# Patient Record
Sex: Female | Born: 1945
Health system: Southern US, Community
[De-identification: ages and names within clinical notes are randomized; demographics above are authoritative.]

## PROBLEM LIST (undated history)

## (undated) DIAGNOSIS — R87619 Unspecified abnormal cytological findings in specimens from cervix uteri: Secondary | ICD-10-CM

## (undated) DIAGNOSIS — R7303 Prediabetes: Secondary | ICD-10-CM

## (undated) DIAGNOSIS — E785 Hyperlipidemia, unspecified: Secondary | ICD-10-CM

## (undated) DIAGNOSIS — I10 Essential (primary) hypertension: Secondary | ICD-10-CM

## (undated) DIAGNOSIS — K219 Gastro-esophageal reflux disease without esophagitis: Secondary | ICD-10-CM

## (undated) DIAGNOSIS — M810 Age-related osteoporosis without current pathological fracture: Secondary | ICD-10-CM

## (undated) DIAGNOSIS — M1990 Unspecified osteoarthritis, unspecified site: Secondary | ICD-10-CM

## (undated) DIAGNOSIS — G5600 Carpal tunnel syndrome, unspecified upper limb: Secondary | ICD-10-CM

---

## 1988-07-06 HISTORY — PX: BREAST CYST ASPIRATION: SHX578

## 2004-04-17 ENCOUNTER — Ambulatory Visit: Payer: Self-pay | Admitting: Internal Medicine

## 2005-04-23 ENCOUNTER — Ambulatory Visit: Payer: Self-pay | Admitting: Internal Medicine

## 2006-05-18 ENCOUNTER — Ambulatory Visit: Payer: Self-pay | Admitting: Internal Medicine

## 2007-05-24 ENCOUNTER — Ambulatory Visit: Payer: Self-pay | Admitting: Obstetrics and Gynecology

## 2008-05-24 ENCOUNTER — Ambulatory Visit: Payer: Self-pay | Admitting: Obstetrics and Gynecology

## 2008-06-12 ENCOUNTER — Ambulatory Visit: Payer: Self-pay | Admitting: Unknown Physician Specialty

## 2008-06-13 ENCOUNTER — Ambulatory Visit: Payer: Self-pay | Admitting: Unknown Physician Specialty

## 2008-06-14 ENCOUNTER — Inpatient Hospital Stay: Payer: Self-pay | Admitting: Unknown Physician Specialty

## 2009-05-27 ENCOUNTER — Ambulatory Visit: Payer: Self-pay | Admitting: Internal Medicine

## 2010-06-11 ENCOUNTER — Ambulatory Visit: Payer: Self-pay | Admitting: Internal Medicine

## 2011-07-29 ENCOUNTER — Ambulatory Visit: Payer: Self-pay | Admitting: Internal Medicine

## 2011-12-24 ENCOUNTER — Ambulatory Visit: Payer: Self-pay | Admitting: Unknown Physician Specialty

## 2012-06-18 ENCOUNTER — Ambulatory Visit: Payer: Self-pay | Admitting: Unknown Physician Specialty

## 2012-07-29 ENCOUNTER — Ambulatory Visit: Payer: Self-pay | Admitting: Internal Medicine

## 2013-07-31 ENCOUNTER — Ambulatory Visit: Payer: Self-pay | Admitting: Internal Medicine

## 2014-07-31 DIAGNOSIS — N8110 Cystocele, unspecified: Secondary | ICD-10-CM | POA: Insufficient documentation

## 2014-08-16 ENCOUNTER — Ambulatory Visit: Payer: Self-pay | Admitting: Internal Medicine

## 2014-09-03 ENCOUNTER — Ambulatory Visit: Payer: Self-pay | Admitting: Internal Medicine

## 2015-07-10 ENCOUNTER — Other Ambulatory Visit: Payer: Self-pay | Admitting: Internal Medicine

## 2015-07-10 DIAGNOSIS — Z1231 Encounter for screening mammogram for malignant neoplasm of breast: Secondary | ICD-10-CM

## 2015-08-21 ENCOUNTER — Ambulatory Visit
Admission: RE | Admit: 2015-08-21 | Discharge: 2015-08-21 | Disposition: A | Discharge status: Home / Self Care | Payer: PPO | Source: Ambulatory Visit | Attending: Internal Medicine | Admitting: Internal Medicine

## 2015-08-21 ENCOUNTER — Other Ambulatory Visit: Payer: Self-pay | Admitting: Internal Medicine

## 2015-08-21 DIAGNOSIS — Z1231 Encounter for screening mammogram for malignant neoplasm of breast: Secondary | ICD-10-CM

## 2015-09-18 DIAGNOSIS — G8929 Other chronic pain: Secondary | ICD-10-CM | POA: Diagnosis not present

## 2015-09-18 DIAGNOSIS — M25552 Pain in left hip: Secondary | ICD-10-CM | POA: Diagnosis not present

## 2015-09-18 DIAGNOSIS — M25551 Pain in right hip: Secondary | ICD-10-CM | POA: Diagnosis not present

## 2015-09-18 DIAGNOSIS — M25561 Pain in right knee: Secondary | ICD-10-CM | POA: Diagnosis not present

## 2015-09-23 DIAGNOSIS — Z0101 Encounter for examination of eyes and vision with abnormal findings: Secondary | ICD-10-CM | POA: Diagnosis not present

## 2015-10-22 DIAGNOSIS — G8929 Other chronic pain: Secondary | ICD-10-CM | POA: Diagnosis not present

## 2015-10-22 DIAGNOSIS — M25561 Pain in right knee: Secondary | ICD-10-CM | POA: Diagnosis not present

## 2015-10-24 ENCOUNTER — Other Ambulatory Visit: Payer: Self-pay | Admitting: Unknown Physician Specialty

## 2015-10-24 DIAGNOSIS — G8929 Other chronic pain: Secondary | ICD-10-CM

## 2015-10-24 DIAGNOSIS — M25561 Pain in right knee: Principal | ICD-10-CM

## 2015-10-24 DIAGNOSIS — G8921 Chronic pain due to trauma: Secondary | ICD-10-CM

## 2015-11-12 ENCOUNTER — Ambulatory Visit
Admission: RE | Admit: 2015-11-12 | Discharge: 2015-11-12 | Disposition: A | Discharge status: Home / Self Care | Payer: PPO | Source: Ambulatory Visit | Attending: Unknown Physician Specialty | Admitting: Unknown Physician Specialty

## 2015-11-12 DIAGNOSIS — Z9689 Presence of other specified functional implants: Secondary | ICD-10-CM | POA: Diagnosis not present

## 2015-11-12 DIAGNOSIS — M7989 Other specified soft tissue disorders: Secondary | ICD-10-CM | POA: Diagnosis not present

## 2015-11-12 DIAGNOSIS — G8929 Other chronic pain: Secondary | ICD-10-CM

## 2015-11-12 DIAGNOSIS — M25561 Pain in right knee: Secondary | ICD-10-CM | POA: Diagnosis not present

## 2015-11-12 DIAGNOSIS — G8921 Chronic pain due to trauma: Secondary | ICD-10-CM | POA: Insufficient documentation

## 2015-11-12 DIAGNOSIS — M1711 Unilateral primary osteoarthritis, right knee: Secondary | ICD-10-CM | POA: Insufficient documentation

## 2015-11-12 DIAGNOSIS — M25461 Effusion, right knee: Secondary | ICD-10-CM | POA: Insufficient documentation

## 2015-11-19 DIAGNOSIS — D2272 Melanocytic nevi of left lower limb, including hip: Secondary | ICD-10-CM | POA: Diagnosis not present

## 2015-11-19 DIAGNOSIS — L821 Other seborrheic keratosis: Secondary | ICD-10-CM | POA: Diagnosis not present

## 2015-11-19 DIAGNOSIS — D225 Melanocytic nevi of trunk: Secondary | ICD-10-CM | POA: Diagnosis not present

## 2015-11-19 DIAGNOSIS — M12561 Traumatic arthropathy, right knee: Secondary | ICD-10-CM | POA: Diagnosis not present

## 2015-11-19 DIAGNOSIS — L728 Other follicular cysts of the skin and subcutaneous tissue: Secondary | ICD-10-CM | POA: Diagnosis not present

## 2016-02-23 DIAGNOSIS — M25532 Pain in left wrist: Secondary | ICD-10-CM | POA: Diagnosis not present

## 2016-02-23 DIAGNOSIS — S52592A Other fractures of lower end of left radius, initial encounter for closed fracture: Secondary | ICD-10-CM | POA: Diagnosis not present

## 2016-02-23 DIAGNOSIS — M79642 Pain in left hand: Secondary | ICD-10-CM | POA: Diagnosis not present

## 2016-02-25 DIAGNOSIS — S52502A Unspecified fracture of the lower end of left radius, initial encounter for closed fracture: Secondary | ICD-10-CM | POA: Diagnosis not present

## 2016-03-03 DIAGNOSIS — S52502A Unspecified fracture of the lower end of left radius, initial encounter for closed fracture: Secondary | ICD-10-CM | POA: Diagnosis not present

## 2016-03-03 DIAGNOSIS — M25532 Pain in left wrist: Secondary | ICD-10-CM | POA: Diagnosis not present

## 2016-03-17 DIAGNOSIS — M25532 Pain in left wrist: Secondary | ICD-10-CM | POA: Diagnosis not present

## 2016-03-17 DIAGNOSIS — S52502A Unspecified fracture of the lower end of left radius, initial encounter for closed fracture: Secondary | ICD-10-CM | POA: Diagnosis not present

## 2016-04-07 DIAGNOSIS — M25532 Pain in left wrist: Secondary | ICD-10-CM | POA: Diagnosis not present

## 2016-04-07 DIAGNOSIS — S52502A Unspecified fracture of the lower end of left radius, initial encounter for closed fracture: Secondary | ICD-10-CM | POA: Diagnosis not present

## 2016-05-30 DIAGNOSIS — J208 Acute bronchitis due to other specified organisms: Secondary | ICD-10-CM | POA: Diagnosis not present

## 2016-05-30 DIAGNOSIS — B9689 Other specified bacterial agents as the cause of diseases classified elsewhere: Secondary | ICD-10-CM | POA: Diagnosis not present

## 2016-05-30 DIAGNOSIS — J019 Acute sinusitis, unspecified: Secondary | ICD-10-CM | POA: Diagnosis not present

## 2016-06-10 DIAGNOSIS — L718 Other rosacea: Secondary | ICD-10-CM | POA: Diagnosis not present

## 2016-06-10 DIAGNOSIS — L720 Epidermal cyst: Secondary | ICD-10-CM | POA: Diagnosis not present

## 2016-06-15 DIAGNOSIS — Z0000 Encounter for general adult medical examination without abnormal findings: Secondary | ICD-10-CM | POA: Diagnosis not present

## 2016-06-15 DIAGNOSIS — R7309 Other abnormal glucose: Secondary | ICD-10-CM | POA: Diagnosis not present

## 2016-06-15 DIAGNOSIS — I10 Essential (primary) hypertension: Secondary | ICD-10-CM | POA: Diagnosis not present

## 2016-06-22 DIAGNOSIS — N950 Postmenopausal bleeding: Secondary | ICD-10-CM | POA: Diagnosis not present

## 2016-06-22 DIAGNOSIS — M810 Age-related osteoporosis without current pathological fracture: Secondary | ICD-10-CM | POA: Diagnosis not present

## 2016-06-22 DIAGNOSIS — E782 Mixed hyperlipidemia: Secondary | ICD-10-CM | POA: Insufficient documentation

## 2016-06-22 DIAGNOSIS — Z0000 Encounter for general adult medical examination without abnormal findings: Secondary | ICD-10-CM | POA: Diagnosis not present

## 2016-06-22 DIAGNOSIS — Z23 Encounter for immunization: Secondary | ICD-10-CM | POA: Diagnosis not present

## 2016-07-02 DIAGNOSIS — M8588 Other specified disorders of bone density and structure, other site: Secondary | ICD-10-CM | POA: Diagnosis not present

## 2016-07-02 DIAGNOSIS — M810 Age-related osteoporosis without current pathological fracture: Secondary | ICD-10-CM | POA: Diagnosis not present

## 2016-07-07 DIAGNOSIS — L723 Sebaceous cyst: Secondary | ICD-10-CM | POA: Diagnosis not present

## 2016-07-27 ENCOUNTER — Other Ambulatory Visit: Payer: Self-pay | Admitting: Internal Medicine

## 2016-07-27 DIAGNOSIS — Z1231 Encounter for screening mammogram for malignant neoplasm of breast: Secondary | ICD-10-CM

## 2016-07-30 DIAGNOSIS — L720 Epidermal cyst: Secondary | ICD-10-CM | POA: Diagnosis not present

## 2016-08-18 DIAGNOSIS — H353131 Nonexudative age-related macular degeneration, bilateral, early dry stage: Secondary | ICD-10-CM | POA: Diagnosis not present

## 2016-08-24 ENCOUNTER — Ambulatory Visit: Payer: PPO

## 2016-08-26 ENCOUNTER — Ambulatory Visit
Admission: RE | Admit: 2016-08-26 | Discharge: 2016-08-26 | Disposition: A | Discharge status: Home / Self Care | Payer: PPO | Source: Ambulatory Visit | Attending: Internal Medicine | Admitting: Internal Medicine

## 2016-08-26 DIAGNOSIS — Z1231 Encounter for screening mammogram for malignant neoplasm of breast: Secondary | ICD-10-CM | POA: Insufficient documentation

## 2017-04-22 DIAGNOSIS — N950 Postmenopausal bleeding: Secondary | ICD-10-CM | POA: Diagnosis not present

## 2017-04-22 DIAGNOSIS — Z01411 Encounter for gynecological examination (general) (routine) with abnormal findings: Secondary | ICD-10-CM | POA: Diagnosis not present

## 2017-04-22 DIAGNOSIS — Z01419 Encounter for gynecological examination (general) (routine) without abnormal findings: Secondary | ICD-10-CM | POA: Diagnosis not present

## 2017-04-22 DIAGNOSIS — N898 Other specified noninflammatory disorders of vagina: Secondary | ICD-10-CM | POA: Diagnosis not present

## 2017-04-22 DIAGNOSIS — Z124 Encounter for screening for malignant neoplasm of cervix: Secondary | ICD-10-CM | POA: Diagnosis not present

## 2017-05-20 DIAGNOSIS — N898 Other specified noninflammatory disorders of vagina: Secondary | ICD-10-CM | POA: Diagnosis not present

## 2017-05-20 DIAGNOSIS — N8110 Cystocele, unspecified: Secondary | ICD-10-CM | POA: Diagnosis not present

## 2017-05-20 DIAGNOSIS — N819 Female genital prolapse, unspecified: Secondary | ICD-10-CM | POA: Diagnosis not present

## 2017-06-02 DIAGNOSIS — M1711 Unilateral primary osteoarthritis, right knee: Secondary | ICD-10-CM | POA: Diagnosis not present

## 2017-06-02 DIAGNOSIS — G8929 Other chronic pain: Secondary | ICD-10-CM | POA: Diagnosis not present

## 2017-06-02 DIAGNOSIS — M25561 Pain in right knee: Secondary | ICD-10-CM | POA: Diagnosis not present

## 2017-06-16 DIAGNOSIS — Z0000 Encounter for general adult medical examination without abnormal findings: Secondary | ICD-10-CM | POA: Diagnosis not present

## 2017-06-23 DIAGNOSIS — E538 Deficiency of other specified B group vitamins: Secondary | ICD-10-CM | POA: Diagnosis not present

## 2017-06-23 DIAGNOSIS — Z0000 Encounter for general adult medical examination without abnormal findings: Secondary | ICD-10-CM | POA: Diagnosis not present

## 2017-06-23 DIAGNOSIS — E782 Mixed hyperlipidemia: Secondary | ICD-10-CM | POA: Diagnosis not present

## 2017-06-23 DIAGNOSIS — E119 Type 2 diabetes mellitus without complications: Secondary | ICD-10-CM | POA: Diagnosis not present

## 2017-06-24 DIAGNOSIS — N8111 Cystocele, midline: Secondary | ICD-10-CM | POA: Diagnosis not present

## 2017-06-24 DIAGNOSIS — N950 Postmenopausal bleeding: Secondary | ICD-10-CM | POA: Diagnosis not present

## 2017-06-24 DIAGNOSIS — N840 Polyp of corpus uteri: Secondary | ICD-10-CM | POA: Diagnosis not present

## 2017-07-13 ENCOUNTER — Encounter: Payer: Self-pay | Admitting: *Deleted

## 2017-07-14 ENCOUNTER — Encounter: Payer: Self-pay | Admitting: Student

## 2017-07-14 ENCOUNTER — Encounter
Admission: RE | Disposition: A | Discharge status: Home / Self Care | Payer: Self-pay | Source: Ambulatory Visit | Attending: Unknown Physician Specialty

## 2017-07-14 ENCOUNTER — Ambulatory Visit
Admission: RE | Admit: 2017-07-14 | Discharge: 2017-07-14 | Disposition: A | Discharge status: Home / Self Care | Payer: PPO | Source: Ambulatory Visit | Attending: Unknown Physician Specialty | Admitting: Unknown Physician Specialty

## 2017-07-14 ENCOUNTER — Ambulatory Visit: Payer: PPO | Admitting: Anesthesiology

## 2017-07-14 ENCOUNTER — Other Ambulatory Visit: Payer: Self-pay

## 2017-07-14 DIAGNOSIS — M810 Age-related osteoporosis without current pathological fracture: Secondary | ICD-10-CM | POA: Insufficient documentation

## 2017-07-14 DIAGNOSIS — K648 Other hemorrhoids: Secondary | ICD-10-CM | POA: Diagnosis not present

## 2017-07-14 DIAGNOSIS — Z79890 Hormone replacement therapy (postmenopausal): Secondary | ICD-10-CM | POA: Diagnosis not present

## 2017-07-14 DIAGNOSIS — K640 First degree hemorrhoids: Secondary | ICD-10-CM | POA: Diagnosis not present

## 2017-07-14 DIAGNOSIS — Z1211 Encounter for screening for malignant neoplasm of colon: Secondary | ICD-10-CM | POA: Diagnosis not present

## 2017-07-14 DIAGNOSIS — Z87891 Personal history of nicotine dependence: Secondary | ICD-10-CM | POA: Diagnosis not present

## 2017-07-14 DIAGNOSIS — G5600 Carpal tunnel syndrome, unspecified upper limb: Secondary | ICD-10-CM | POA: Diagnosis not present

## 2017-07-14 DIAGNOSIS — Z79899 Other long term (current) drug therapy: Secondary | ICD-10-CM | POA: Diagnosis not present

## 2017-07-14 DIAGNOSIS — Z7982 Long term (current) use of aspirin: Secondary | ICD-10-CM | POA: Diagnosis not present

## 2017-07-14 DIAGNOSIS — Z86010 Personal history of colonic polyps: Secondary | ICD-10-CM | POA: Diagnosis not present

## 2017-07-14 DIAGNOSIS — Z9889 Other specified postprocedural states: Secondary | ICD-10-CM | POA: Diagnosis not present

## 2017-07-14 DIAGNOSIS — M1990 Unspecified osteoarthritis, unspecified site: Secondary | ICD-10-CM | POA: Diagnosis not present

## 2017-07-14 HISTORY — DX: Hyperlipidemia, unspecified: E78.5

## 2017-07-14 HISTORY — DX: Carpal tunnel syndrome, unspecified upper limb: G56.00

## 2017-07-14 HISTORY — PX: COLONOSCOPY WITH PROPOFOL: SHX5780

## 2017-07-14 HISTORY — DX: Unspecified abnormal cytological findings in specimens from cervix uteri: R87.619

## 2017-07-14 HISTORY — DX: Age-related osteoporosis without current pathological fracture: M81.0

## 2017-07-14 HISTORY — DX: Unspecified osteoarthritis, unspecified site: M19.90

## 2017-07-14 SURGERY — COLONOSCOPY WITH PROPOFOL
Anesthesia: General

## 2017-07-14 MED ORDER — LIDOCAINE 2% (20 MG/ML) 5 ML SYRINGE
INTRAMUSCULAR | Status: DC | PRN
  Administered 2017-07-14: 40 mg via INTRAVENOUS

## 2017-07-14 MED ORDER — PROPOFOL 10 MG/ML IV BOLUS
INTRAVENOUS | Status: DC | PRN
  Administered 2017-07-14: 100 mg via INTRAVENOUS

## 2017-07-14 MED ORDER — PROPOFOL 500 MG/50ML IV EMUL
INTRAVENOUS | Status: AC
  Filled 2017-07-14: qty 50

## 2017-07-14 MED ORDER — SODIUM CHLORIDE 0.9 % IV SOLN: INTRAVENOUS | Status: DC

## 2017-07-14 MED ORDER — PROPOFOL 500 MG/50ML IV EMUL
INTRAVENOUS | Status: DC | PRN
  Administered 2017-07-14: 140 ug/kg/min via INTRAVENOUS

## 2017-07-14 MED ORDER — SODIUM CHLORIDE 0.9 % IV SOLN
INTRAVENOUS | Status: DC
  Administered 2017-07-14 (×2): via INTRAVENOUS

## 2017-07-14 NOTE — Anesthesia Post-op Follow-up Note (Signed)
Anesthesia QCDR form completed.        

## 2017-07-14 NOTE — Anesthesia Preprocedure Evaluation (Signed)
Anesthesia Evaluation  Patient identified by MRN, date of birth, ID band Patient awake    Reviewed: Allergy & Precautions, H&P , NPO status , Patient's Chart, lab work & pertinent test results, reviewed documented beta blocker date and time   Airway Mallampati: II   Neck ROM: full    Dental  (+) Poor Dentition   Pulmonary neg pulmonary ROS, former smoker,    Pulmonary exam normal        Cardiovascular negative cardio ROS Normal cardiovascular exam Rhythm:regular Rate:Normal     Neuro/Psych  Neuromuscular disease negative neurological ROS  negative psych ROS   GI/Hepatic negative GI ROS, Neg liver ROS,   Endo/Other  negative endocrine ROS  Renal/GU negative Renal ROS  negative genitourinary   Musculoskeletal   Abdominal   Peds  Hematology negative hematology ROS (+)   Anesthesia Other Findings Past Medical History: No date: Abnormal cervical Papanicolaou smear No date: Carpal tunnel syndrome No date: Hyperlipidemia No date: Osteoarthritis No date: Osteoporosis Past Surgical History: 1990: BREAST CYST ASPIRATION; Left     Comment:  neg BMI    Body Mass Index:  27.12 kg/m     Reproductive/Obstetrics negative OB ROS                             Anesthesia Physical Anesthesia Plan  ASA: II  Anesthesia Plan: General   Post-op Pain Management:    Induction:   PONV Risk Score and Plan:   Airway Management Planned:   Additional Equipment:   Intra-op Plan:   Post-operative Plan:   Informed Consent: I have reviewed the patients History and Physical, chart, labs and discussed the procedure including the risks, benefits and alternatives for the proposed anesthesia with the patient or authorized representative who has indicated his/her understanding and acceptance.   Dental Advisory Given  Plan Discussed with: CRNA  Anesthesia Plan Comments:         Anesthesia Quick  Evaluation

## 2017-07-14 NOTE — Op Note (Signed)
[facility] Gastroenterology Patient Name: Claire Bishop Procedure Date: 07/14/2017 3:06 PM MRN: [id number] Account #: [id number] Date of Birth: Nov 13, 1945 Admit Type: Outpatient Age: 72 Room: [facility] ENDO ROOM 4 Gender: Female Note Status: Finalized Procedure:            Colonoscopy Indications:          Screening for colorectal malignant neoplasm Providers:            Manya Silvas, MD Referring MD:         Rusty Aus, MD (Referring MD) Medicines:            Propofol per Anesthesia Complications:        No immediate complications. Procedure:            Pre-Anesthesia Assessment:                       - After reviewing the risks and benefits, the patient                        was deemed in satisfactory condition to undergo the                        procedure.                       After obtaining informed consent, the colonoscope was                        passed under direct vision. Throughout the procedure,                        the patient's blood pressure, pulse, and oxygen                        saturations were monitored continuously. The                        Colonoscope was introduced through the anus and                        advanced to the the cecum, identified by appendiceal                        orifice and ileocecal valve. The colonoscopy was                        performed without difficulty. The patient tolerated the                        procedure well. The quality of the bowel preparation                        was excellent. Findings:      Internal hemorrhoids were found during endoscopy. The hemorrhoids were       small and Grade I (internal hemorrhoids that do not prolapse).      The exam was otherwise without abnormality. Impression:           - Internal hemorrhoids.                       - The examination was  otherwise normal.                       - No specimens collected. Recommendation:       - Repeat colonoscopy in 10  years for screening                        purposes. If in very good health. Manya Silvas, MD 07/14/2017 3:32:05 PM This report has been signed electronically. Number of Addenda: 0 Note Initiated On: 07/14/2017 3:06 PM Scope Withdrawal Time: 0 hours 7 minutes 42 seconds  Total Procedure Duration: 0 hours 16 minutes 21 seconds       [facility]

## 2017-07-14 NOTE — H&P (Signed)
Primary Care Physician:  Rusty Aus, MD Primary Gastroenterologist:  Dr. Vira Agar  Pre-Procedure History & Physical: HPI:  Claire Bishop is a 72 y.o. female is here for an colonoscopy.   Past Medical History:  Diagnosis Date  . Abnormal cervical Papanicolaou smear   . Carpal tunnel syndrome   . Hyperlipidemia   . Osteoarthritis   . Osteoporosis     Past Surgical History:  Procedure Laterality Date  . BREAST CYST ASPIRATION Left 1990   neg    Prior to Admission medications   Medication Sig Start Date End Date Taking? Authorizing Provider  aspirin EC 81 MG tablet Take 81 mg by mouth daily.   Yes [provider]  calcium-vitamin D (OSCAL WITH D) 500-200 MG-UNIT tablet Take 1 tablet by mouth.   Yes [provider]  Cholecalciferol 2000 units CAPS Take by mouth.   Yes [provider]  estradiol (ESTRACE) 0.1 MG/GM vaginal cream Place 1 Applicatorful vaginally at bedtime.   Yes [provider]  hydrochlorothiazide (HYDRODIURIL) 25 MG tablet Take 25 mg by mouth daily.   Yes [provider]  pantoprazole (PROTONIX) 40 MG tablet Take 40 mg by mouth daily.   Yes [provider]    Allergies as of 07/02/2017  . (Not on File)    Family History  Problem Relation Age of Onset  . Breast cancer Neg Hx     Social History   Socioeconomic History  . Marital status: Married    Spouse name: Not on file  . Number of children: Not on file  . Years of education: Not on file  . Highest education level: Not on file  Social Needs  . Financial resource strain: Not on file  . Food insecurity - worry: Not on file  . Food insecurity - inability: Not on file  . Transportation needs - medical: Not on file  . Transportation needs - non-medical: Not on file  Occupational History  . Not on file  Tobacco Use  . Smoking status: Former Smoker    Last attempt to quit: 07/06/1988    Years since quitting: 29.0  . Smokeless tobacco: Never  Used  Substance and Sexual Activity  . Alcohol use: Yes    Alcohol/week: 0.6 oz    Types: 1 Glasses of wine per week  . Drug use: No  . Sexual activity: Not on file  Other Topics Concern  . Not on file  Social History Narrative  . Not on file    Review of Systems: See HPI, otherwise negative ROS  Physical Exam: BP 119/74   Pulse 85   Temp 98.8 F (37.1 C) (Tympanic)   Resp 16   Ht 5\' 4"  (1.626 m)   Wt 71.7 kg (158 lb)   SpO2 98%   BMI 27.12 kg/m  General:   Alert,  pleasant and cooperative in NAD Head:  Normocephalic and atraumatic. Neck:  Supple; no masses or thyromegaly. Lungs:  Clear throughout to auscultation.    Heart:  Regular rate and rhythm. Abdomen:  Soft, nontender and nondistended. Normal bowel sounds, without guarding, and without rebound.   Neurologic:  Alert and  oriented x4;  grossly normal neurologically.  Impression/Plan: Claire Bishop is here for an colonoscopy to be performed for [facility] colon polyps.  Risks, benefits, limitations, and alternatives regarding  colonoscopy have been reviewed with the patient.  Questions have been answered.  All parties agreeable.   Gaylyn Cheers, MD  07/14/2017, 3:00 PM

## 2017-07-14 NOTE — Transfer of Care (Signed)
Immediate Anesthesia Transfer of Care Note  Patient: Claire Bishop  Procedure(s) Performed: COLONOSCOPY WITH PROPOFOL (N/A )  Patient Location: PACU and Endoscopy Unit  Anesthesia Type:General  Level of Consciousness: drowsy  Airway & Oxygen Therapy: Patient Spontanous Breathing and Patient connected to nasal cannula oxygen  Post-op Assessment: Report given to RN and Post -op Vital signs reviewed and stable  Post vital signs: Reviewed and stable  Last Vitals:  Vitals:   07/14/17 1405  BP: 119/74  Pulse: 85  Resp: 16  Temp: 37.1 C  SpO2: 98%    Last Pain:  Vitals:   07/14/17 1405  TempSrc: Tympanic         Complications: No apparent anesthesia complications

## 2017-07-15 ENCOUNTER — Encounter: Payer: Self-pay | Admitting: Unknown Physician Specialty

## 2017-07-16 NOTE — Anesthesia Postprocedure Evaluation (Signed)
Anesthesia Post Note  Patient: Claire Bishop  Procedure(s) Performed: COLONOSCOPY WITH PROPOFOL (N/A )  Patient location during evaluation: Endoscopy Anesthesia Type: General Level of consciousness: awake and alert Pain management: pain level controlled Vital Signs Assessment: post-procedure vital signs reviewed and stable Respiratory status: spontaneous breathing, nonlabored ventilation, respiratory function stable and patient connected to nasal cannula oxygen Cardiovascular status: blood pressure returned to baseline and stable Postop Assessment: no apparent nausea or vomiting Anesthetic complications: no     Last Vitals:  Vitals:   07/14/17 1544 07/14/17 1553  BP: 94/77 100/87  Pulse: 71 69  Resp: 19 (!) 28  Temp:    SpO2: 96% 97%    Last Pain:  Vitals:   07/15/17 0814  TempSrc:   PainSc: 0-No pain                 Martha Clan

## 2017-08-10 DIAGNOSIS — N813 Complete uterovaginal prolapse: Secondary | ICD-10-CM | POA: Diagnosis not present

## 2017-08-10 DIAGNOSIS — E119 Type 2 diabetes mellitus without complications: Secondary | ICD-10-CM | POA: Diagnosis not present

## 2017-08-10 DIAGNOSIS — K219 Gastro-esophageal reflux disease without esophagitis: Secondary | ICD-10-CM | POA: Insufficient documentation

## 2017-08-16 ENCOUNTER — Other Ambulatory Visit: Payer: Self-pay | Admitting: Internal Medicine

## 2017-08-16 DIAGNOSIS — Z1231 Encounter for screening mammogram for malignant neoplasm of breast: Secondary | ICD-10-CM

## 2017-08-30 DIAGNOSIS — E1140 Type 2 diabetes mellitus with diabetic neuropathy, unspecified: Secondary | ICD-10-CM | POA: Diagnosis not present

## 2017-08-30 DIAGNOSIS — E119 Type 2 diabetes mellitus without complications: Secondary | ICD-10-CM | POA: Diagnosis not present

## 2017-08-30 DIAGNOSIS — N8111 Cystocele, midline: Secondary | ICD-10-CM | POA: Diagnosis not present

## 2017-08-30 DIAGNOSIS — N8182 Incompetence or weakening of pubocervical tissue: Secondary | ICD-10-CM | POA: Diagnosis not present

## 2017-08-30 DIAGNOSIS — K219 Gastro-esophageal reflux disease without esophagitis: Secondary | ICD-10-CM | POA: Diagnosis not present

## 2017-08-30 DIAGNOSIS — N813 Complete uterovaginal prolapse: Secondary | ICD-10-CM | POA: Diagnosis not present

## 2017-08-30 DIAGNOSIS — N812 Incomplete uterovaginal prolapse: Secondary | ICD-10-CM | POA: Diagnosis not present

## 2017-08-30 DIAGNOSIS — Z79899 Other long term (current) drug therapy: Secondary | ICD-10-CM | POA: Diagnosis not present

## 2017-08-30 DIAGNOSIS — Z7982 Long term (current) use of aspirin: Secondary | ICD-10-CM | POA: Diagnosis not present

## 2017-08-30 DIAGNOSIS — I10 Essential (primary) hypertension: Secondary | ICD-10-CM | POA: Diagnosis not present

## 2017-08-30 DIAGNOSIS — M1711 Unilateral primary osteoarthritis, right knee: Secondary | ICD-10-CM | POA: Diagnosis not present

## 2017-08-30 DIAGNOSIS — Z87891 Personal history of nicotine dependence: Secondary | ICD-10-CM | POA: Diagnosis not present

## 2017-08-30 DIAGNOSIS — N950 Postmenopausal bleeding: Secondary | ICD-10-CM | POA: Diagnosis not present

## 2017-08-30 DIAGNOSIS — N765 Ulceration of vagina: Secondary | ICD-10-CM | POA: Diagnosis not present

## 2017-08-30 DIAGNOSIS — H3530 Unspecified macular degeneration: Secondary | ICD-10-CM | POA: Diagnosis not present

## 2017-09-10 ENCOUNTER — Ambulatory Visit
Admission: RE | Admit: 2017-09-10 | Discharge: 2017-09-10 | Disposition: A | Discharge status: Home / Self Care | Payer: PPO | Source: Ambulatory Visit | Attending: Internal Medicine | Admitting: Internal Medicine

## 2017-09-10 DIAGNOSIS — Z1231 Encounter for screening mammogram for malignant neoplasm of breast: Secondary | ICD-10-CM | POA: Insufficient documentation

## 2017-09-23 DIAGNOSIS — H353131 Nonexudative age-related macular degeneration, bilateral, early dry stage: Secondary | ICD-10-CM | POA: Diagnosis not present

## 2017-10-19 DIAGNOSIS — E119 Type 2 diabetes mellitus without complications: Secondary | ICD-10-CM | POA: Diagnosis not present

## 2017-10-19 DIAGNOSIS — E538 Deficiency of other specified B group vitamins: Secondary | ICD-10-CM | POA: Diagnosis not present

## 2017-10-19 DIAGNOSIS — E782 Mixed hyperlipidemia: Secondary | ICD-10-CM | POA: Diagnosis not present

## 2017-10-26 DIAGNOSIS — Z0000 Encounter for general adult medical examination without abnormal findings: Secondary | ICD-10-CM | POA: Diagnosis not present

## 2017-10-26 DIAGNOSIS — E119 Type 2 diabetes mellitus without complications: Secondary | ICD-10-CM | POA: Diagnosis not present

## 2017-11-02 DIAGNOSIS — M1731 Unilateral post-traumatic osteoarthritis, right knee: Secondary | ICD-10-CM | POA: Diagnosis not present

## 2018-02-21 DIAGNOSIS — E119 Type 2 diabetes mellitus without complications: Secondary | ICD-10-CM | POA: Diagnosis not present

## 2018-02-28 DIAGNOSIS — E1169 Type 2 diabetes mellitus with other specified complication: Secondary | ICD-10-CM | POA: Diagnosis not present

## 2018-02-28 DIAGNOSIS — M810 Age-related osteoporosis without current pathological fracture: Secondary | ICD-10-CM | POA: Diagnosis not present

## 2018-02-28 DIAGNOSIS — E538 Deficiency of other specified B group vitamins: Secondary | ICD-10-CM | POA: Diagnosis not present

## 2018-02-28 DIAGNOSIS — E782 Mixed hyperlipidemia: Secondary | ICD-10-CM | POA: Insufficient documentation

## 2018-03-09 DIAGNOSIS — R079 Chest pain, unspecified: Secondary | ICD-10-CM | POA: Diagnosis not present

## 2018-03-09 DIAGNOSIS — R0789 Other chest pain: Secondary | ICD-10-CM | POA: Diagnosis not present

## 2018-04-25 DIAGNOSIS — M1731 Unilateral post-traumatic osteoarthritis, right knee: Secondary | ICD-10-CM | POA: Diagnosis not present

## 2018-05-27 IMAGING — MG MM DIGITAL SCREENING BILAT W/ TOMO W/ CAD
8 of 12 series · 8 of 28 positions shown · non-contrast
Comparison: Previous exam(s).

CLINICAL DATA: Screening.

EXAM:
DIGITAL SCREENING BILATERAL MAMMOGRAM WITH TOMO AND CAD

[L MLO synth-2D]
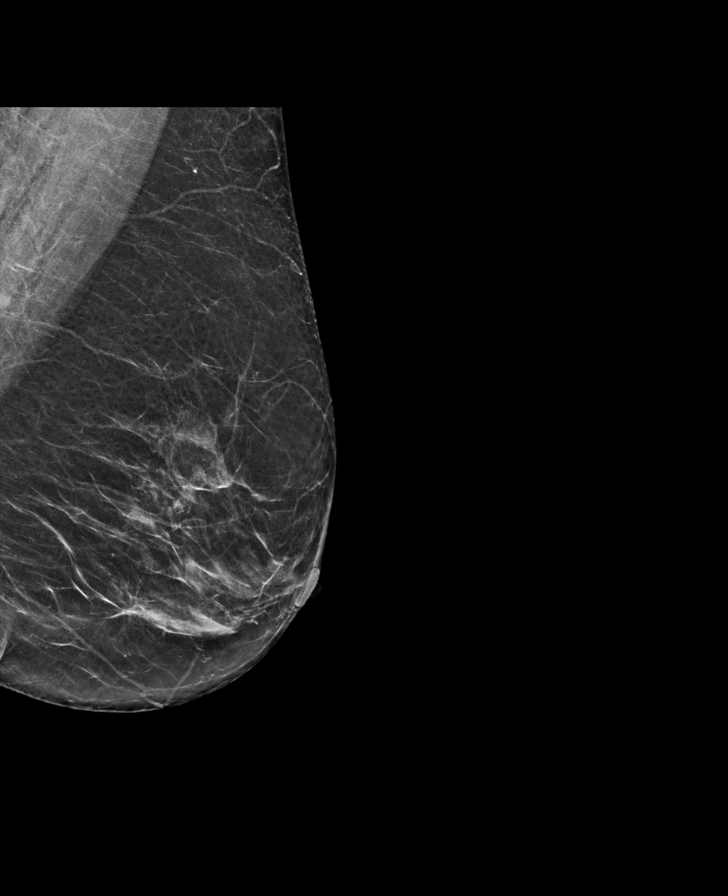

[L CC]
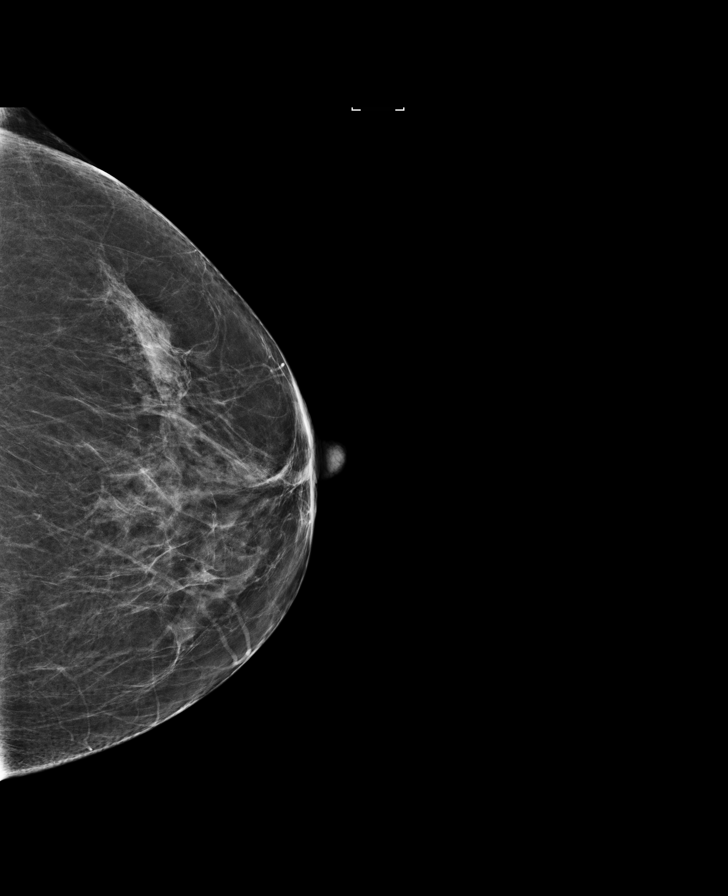

[R CC]
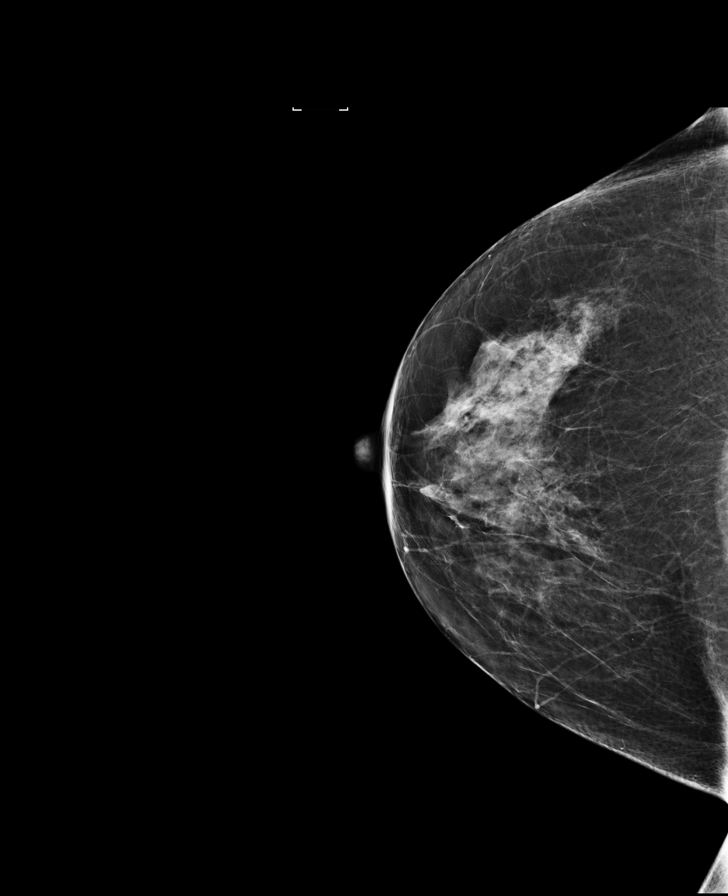

[R CC synth-2D]
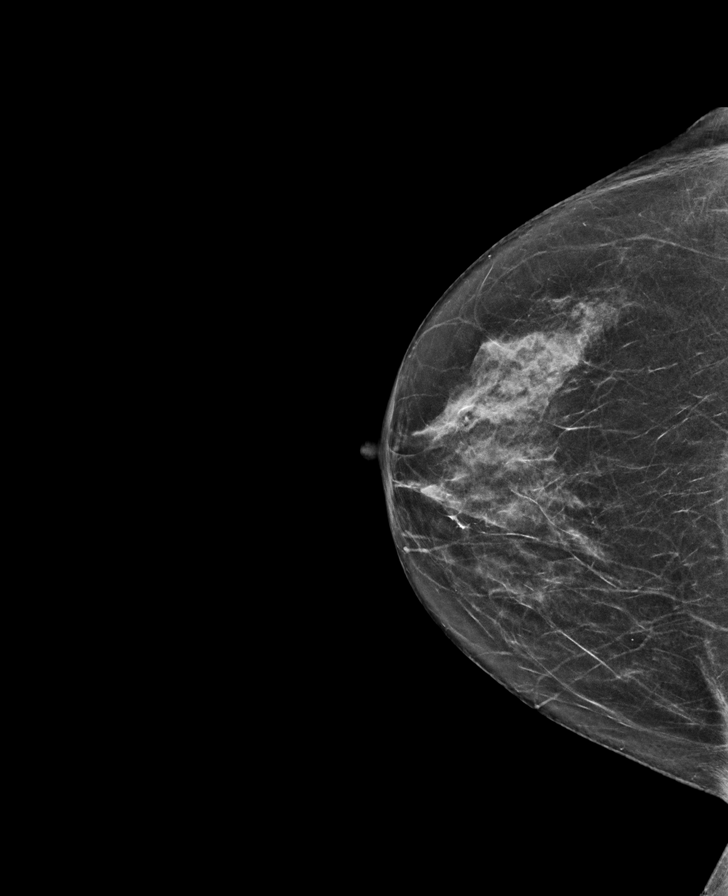

[R MLO synth-2D]
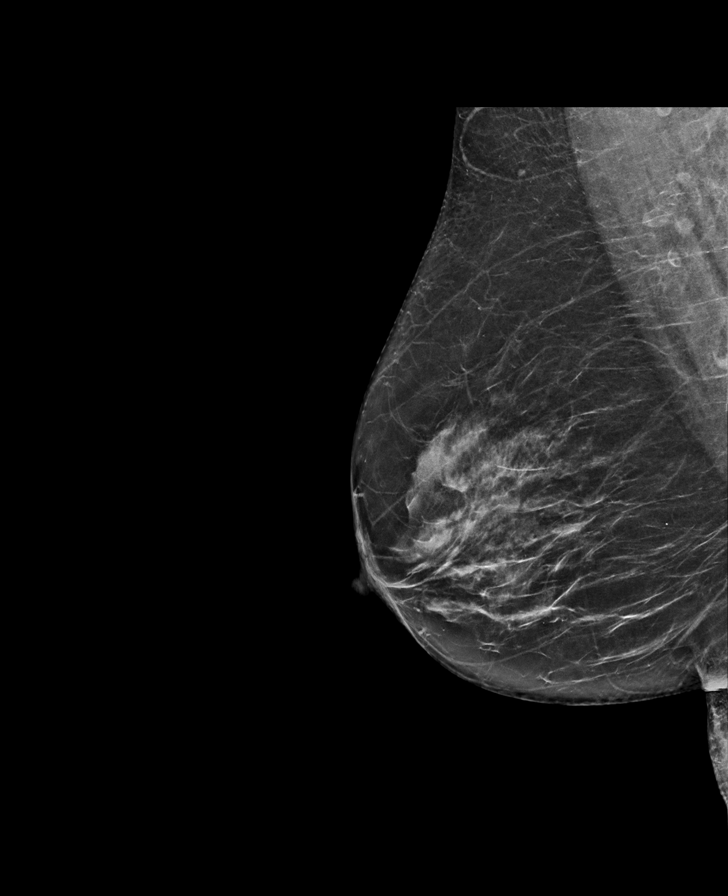

[R MLO]
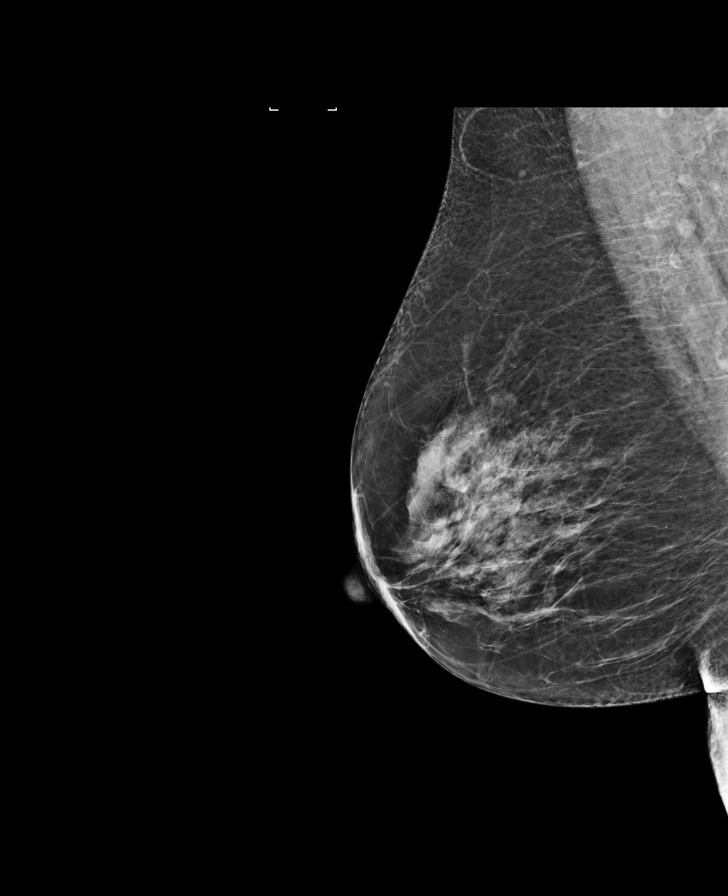

[L MLO]
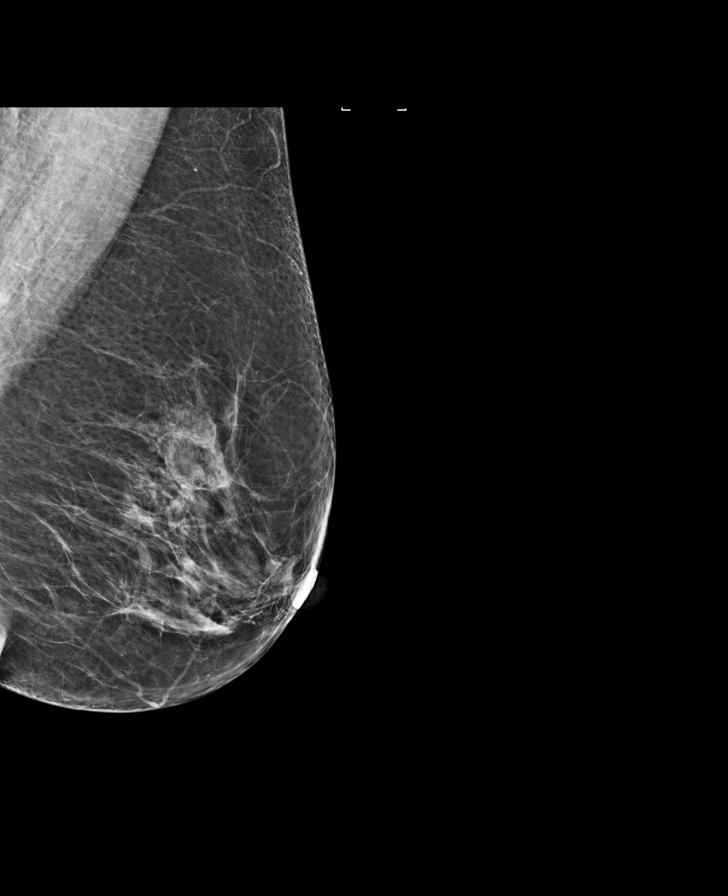

[L CC synth-2D]
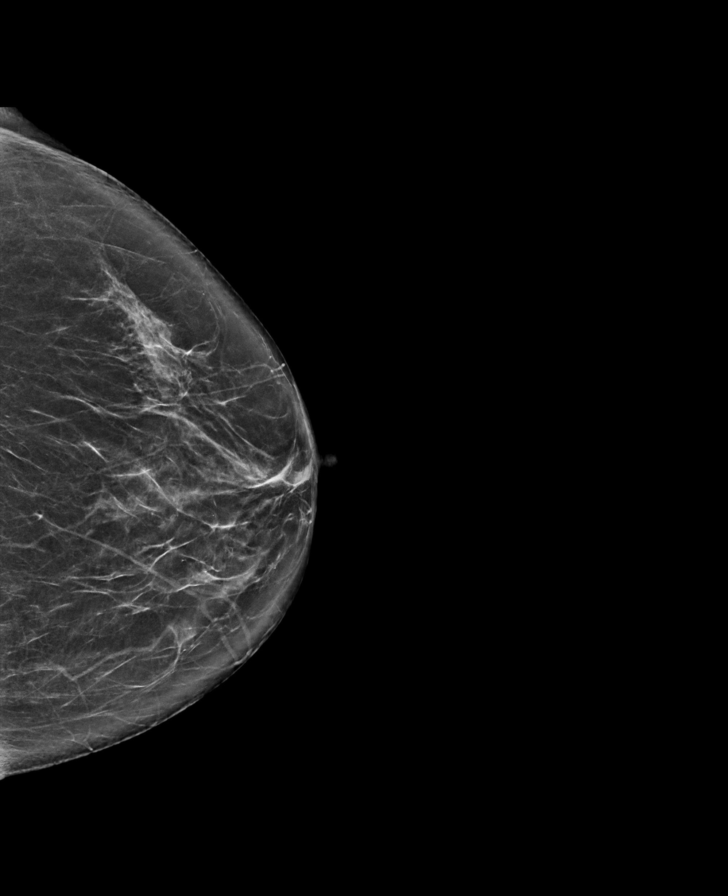

[8 of 28 positions shown; findings below may reference images not displayed]

ACR Breast Density Category c: The breast tissue is heterogeneously
dense, which may obscure small masses.
FINDINGS: There are no findings suspicious for malignancy. Images were
processed with CAD.
IMPRESSION: No mammographic evidence of malignancy. A result letter of this
screening mammogram will be mailed directly to the patient.

RECOMMENDATION:
Screening mammogram in one year. (Code:[id number])

BI-RADS CATEGORY  1: Negative.

## 2018-06-21 DIAGNOSIS — E538 Deficiency of other specified B group vitamins: Secondary | ICD-10-CM | POA: Diagnosis not present

## 2018-06-21 DIAGNOSIS — M810 Age-related osteoporosis without current pathological fracture: Secondary | ICD-10-CM | POA: Diagnosis not present

## 2018-06-21 DIAGNOSIS — E782 Mixed hyperlipidemia: Secondary | ICD-10-CM | POA: Diagnosis not present

## 2018-06-21 DIAGNOSIS — E1169 Type 2 diabetes mellitus with other specified complication: Secondary | ICD-10-CM | POA: Diagnosis not present

## 2018-06-30 DIAGNOSIS — E1169 Type 2 diabetes mellitus with other specified complication: Secondary | ICD-10-CM | POA: Diagnosis not present

## 2018-06-30 DIAGNOSIS — E782 Mixed hyperlipidemia: Secondary | ICD-10-CM | POA: Diagnosis not present

## 2018-06-30 DIAGNOSIS — E538 Deficiency of other specified B group vitamins: Secondary | ICD-10-CM | POA: Diagnosis not present

## 2018-06-30 DIAGNOSIS — Z0000 Encounter for general adult medical examination without abnormal findings: Secondary | ICD-10-CM | POA: Diagnosis not present

## 2018-07-12 ENCOUNTER — Encounter: Payer: PPO | Attending: Internal Medicine | Admitting: Dietician

## 2018-07-12 VITALS — Ht 64.0 in | Wt 164.8 lb

## 2018-07-12 DIAGNOSIS — Z713 Dietary counseling and surveillance: Secondary | ICD-10-CM | POA: Diagnosis not present

## 2018-07-12 DIAGNOSIS — E119 Type 2 diabetes mellitus without complications: Secondary | ICD-10-CM | POA: Diagnosis not present

## 2018-07-12 NOTE — Progress Notes (Signed)
Medical Nutrition Therapy: Visit start time: 10:35am end time: 11:40am  Assessment:  Diagnosis: Type 2 Diabetes Past medical history: hyperlipidemia Psychosocial issues/ stress concerns: Patient rates her stress as "moderate" and indicates "ok" as to how well she is dealing with her stress.  Preferred learning method:  . Hands-on  Current weight: 164.8 lbs  Height: 63 in Medications, supplements: see list  Progress and evaluation:  Patient in for initial medical nutrition therapy appointment. She reports a weight gain of 5-7 lbs in the past 3 months. Due to pain in her knee she had stopped exercising. Her most recent HgA1c was 6.5. She states, "I want to understand carbs more" She has made recent changes to limit bread and decrease sweets. She has eliminated fruits and includes 1 serving of vegetables daily despite liking a variety of these food groups. Her overall diet is adequate in protein and low in fruits/vegetables, fiber and whole grains.     Physical activity: Has just resumed walking for exercise with goal of 4-5 x per week for 30 minutes. Dietary Intake:  Usual eating pattern includes 3 meals and 1-2 snacks per day. Dining out frequency: 2 meals per week.  Breakfast: 8:30am- [place] yogurt or 1 cup oatmeal or 1-2 eggs, 1 slice toast, [place] bacon, grape jelly Lunch: 12:30pm- tuna with pickles/mustard, 3-4 crackers, diet pepsi, Crystal lite decaf. tea Snack: celery with peanut butter Supper: 6:00pm- Ex. Spaghetti sauce made with ground [place], pasta, SF tea Snack:almonds or popcorn; after Christmas as been eating a slice of cake 3 evenings per week; froze it by the piece. Beverages:water, diet pepsi, crystal lite tea.  Nutrition Care Education: Diabetes:   Instructed on a meal plan including carbohydrate counting, portion control and how to better balance carbohydrate, protein and non-starchy vegetables. Gave and reviewed sample menus. Used food guide plate and food models  to teach.  Nutritional Diagnosis:  NI-5.11.1 Predicted suboptimal nutrient intake As related to fruits, vegetables, whole grains.  As evidenced by diet history..  Intervention:  Balance meals with 1-3 oz protein, 2-3 servings of carbohydrate (30-45 gms)  and non-starchy vegetables. Try to keep snacks in 15-20 gms carbohdyrate range. Spread 8-9 servings of carbohydrate over 3 meals and 1-2 snacks. Think of food guide plate when balancing meals. Read labels for carbohydrate. Can subtract fiber. Education Materials given:  . Plate Planner . Food lists/ Planning A Balanced Meal . Combination Foods . Sample meal pattern/ menus . Goals/ instructions  Learner/ who was taught:  . Patient   Level of understanding: Marland Kitchen Verbalizes understanding  Demonstrated degree of understanding via:   Teach back Learning barriers: . None Willingness to learn/ readiness for change: . Eager, change in progress  Monitoring and Evaluation:   No follow-up was scheduled. Encouraged patient to call if she desires further help with her diet/nutrition.

## 2018-07-12 NOTE — Patient Instructions (Addendum)
Balance meals with 1-3 oz protein, 2-3 servings of carbohydrate (30-45 gms)  and non-starchy vegetables. Try to keep snacks in 15-20gms carbohydrate range. Spread 8-9 servings of carbohydrate over 3 meals and 1-2 snacks. Think of food guide plate when balancing meals. Read labels for carbohydrate. Can subtract fiber.

## 2018-08-03 ENCOUNTER — Other Ambulatory Visit: Payer: Self-pay | Admitting: Internal Medicine

## 2018-08-03 DIAGNOSIS — Z1231 Encounter for screening mammogram for malignant neoplasm of breast: Secondary | ICD-10-CM

## 2018-08-05 DIAGNOSIS — M1711 Unilateral primary osteoarthritis, right knee: Secondary | ICD-10-CM | POA: Insufficient documentation

## 2018-09-12 ENCOUNTER — Ambulatory Visit
Admission: RE | Admit: 2018-09-12 | Discharge: 2018-09-12 | Disposition: A | Discharge status: Home / Self Care | Payer: PPO | Source: Ambulatory Visit | Attending: Internal Medicine | Admitting: Internal Medicine

## 2018-09-12 DIAGNOSIS — Z1231 Encounter for screening mammogram for malignant neoplasm of breast: Secondary | ICD-10-CM | POA: Insufficient documentation

## 2018-11-01 DIAGNOSIS — N3090 Cystitis, unspecified without hematuria: Secondary | ICD-10-CM | POA: Diagnosis not present

## 2018-11-01 DIAGNOSIS — R300 Dysuria: Secondary | ICD-10-CM | POA: Diagnosis not present

## 2018-11-01 DIAGNOSIS — B373 Candidiasis of vulva and vagina: Secondary | ICD-10-CM | POA: Diagnosis not present

## 2018-11-01 DIAGNOSIS — Z0000 Encounter for general adult medical examination without abnormal findings: Secondary | ICD-10-CM | POA: Diagnosis not present

## 2018-12-20 DIAGNOSIS — E1169 Type 2 diabetes mellitus with other specified complication: Secondary | ICD-10-CM | POA: Diagnosis not present

## 2018-12-20 DIAGNOSIS — E538 Deficiency of other specified B group vitamins: Secondary | ICD-10-CM | POA: Diagnosis not present

## 2018-12-20 DIAGNOSIS — E782 Mixed hyperlipidemia: Secondary | ICD-10-CM | POA: Diagnosis not present

## 2018-12-27 DIAGNOSIS — E782 Mixed hyperlipidemia: Secondary | ICD-10-CM | POA: Diagnosis not present

## 2018-12-27 DIAGNOSIS — M810 Age-related osteoporosis without current pathological fracture: Secondary | ICD-10-CM | POA: Diagnosis not present

## 2018-12-27 DIAGNOSIS — E1169 Type 2 diabetes mellitus with other specified complication: Secondary | ICD-10-CM | POA: Diagnosis not present

## 2018-12-27 DIAGNOSIS — E538 Deficiency of other specified B group vitamins: Secondary | ICD-10-CM | POA: Diagnosis not present

## 2018-12-27 DIAGNOSIS — M76899 Other specified enthesopathies of unspecified lower limb, excluding foot: Secondary | ICD-10-CM | POA: Diagnosis not present

## 2019-01-30 DIAGNOSIS — M1731 Unilateral post-traumatic osteoarthritis, right knee: Secondary | ICD-10-CM | POA: Diagnosis not present

## 2019-04-11 DIAGNOSIS — F5104 Psychophysiologic insomnia: Secondary | ICD-10-CM | POA: Diagnosis not present

## 2019-04-11 DIAGNOSIS — M76899 Other specified enthesopathies of unspecified lower limb, excluding foot: Secondary | ICD-10-CM | POA: Diagnosis not present

## 2019-04-28 ENCOUNTER — Other Ambulatory Visit: Payer: Self-pay

## 2019-04-28 DIAGNOSIS — Z20828 Contact with and (suspected) exposure to other viral communicable diseases: Secondary | ICD-10-CM | POA: Diagnosis not present

## 2019-04-28 DIAGNOSIS — Z20822 Contact with and (suspected) exposure to covid-19: Secondary | ICD-10-CM

## 2019-04-30 LAB — NOVEL CORONAVIRUS, NAA: SARS-CoV-2, NAA: NOT DETECTED

## 2019-05-14 DIAGNOSIS — Z20828 Contact with and (suspected) exposure to other viral communicable diseases: Secondary | ICD-10-CM | POA: Diagnosis not present

## 2019-05-14 DIAGNOSIS — J029 Acute pharyngitis, unspecified: Secondary | ICD-10-CM | POA: Diagnosis not present

## 2019-06-26 DIAGNOSIS — E782 Mixed hyperlipidemia: Secondary | ICD-10-CM | POA: Diagnosis not present

## 2019-06-26 DIAGNOSIS — E538 Deficiency of other specified B group vitamins: Secondary | ICD-10-CM | POA: Diagnosis not present

## 2019-06-26 DIAGNOSIS — M810 Age-related osteoporosis without current pathological fracture: Secondary | ICD-10-CM | POA: Diagnosis not present

## 2019-06-26 DIAGNOSIS — E1169 Type 2 diabetes mellitus with other specified complication: Secondary | ICD-10-CM | POA: Diagnosis not present

## 2019-07-03 DIAGNOSIS — Z0000 Encounter for general adult medical examination without abnormal findings: Secondary | ICD-10-CM | POA: Diagnosis not present

## 2019-07-03 DIAGNOSIS — E538 Deficiency of other specified B group vitamins: Secondary | ICD-10-CM | POA: Diagnosis not present

## 2019-07-03 DIAGNOSIS — M791 Myalgia, unspecified site: Secondary | ICD-10-CM | POA: Diagnosis not present

## 2019-07-03 DIAGNOSIS — E1169 Type 2 diabetes mellitus with other specified complication: Secondary | ICD-10-CM | POA: Diagnosis not present

## 2019-07-03 DIAGNOSIS — T466X5A Adverse effect of antihyperlipidemic and antiarteriosclerotic drugs, initial encounter: Secondary | ICD-10-CM | POA: Diagnosis not present

## 2019-07-03 DIAGNOSIS — E782 Mixed hyperlipidemia: Secondary | ICD-10-CM | POA: Diagnosis not present

## 2019-08-07 ENCOUNTER — Other Ambulatory Visit: Payer: Self-pay | Admitting: Internal Medicine

## 2019-08-07 DIAGNOSIS — Z1231 Encounter for screening mammogram for malignant neoplasm of breast: Secondary | ICD-10-CM

## 2019-08-18 ENCOUNTER — Ambulatory Visit: Payer: PPO | Attending: Internal Medicine

## 2019-08-18 DIAGNOSIS — Z23 Encounter for immunization: Secondary | ICD-10-CM | POA: Insufficient documentation

## 2019-08-18 NOTE — Progress Notes (Signed)
   Covid-19 Vaccination Clinic  Name:  Claire Bishop    MRN: [id number] DOB: 08-20-1945  08/18/2019  [name] was observed post Covid-19 immunization for 15 minutes without incidence. She was provided with Vaccine Information Sheet and instruction to access the V-Safe system.   [name] was instructed to call 911 with any severe reactions post vaccine: Marland Kitchen Difficulty breathing  . Swelling of your face and throat  . A fast heartbeat  . A bad rash all over your body  . Dizziness and weakness    Immunizations Administered    Name Date Dose VIS Date Route   Pfizer COVID-19 Vaccine 08/18/2019  9:18 AM 0.3 mL 06/16/2019 Intramuscular   Manufacturer: [place]   Lot: [id number]   [place]: [id number]

## 2019-08-19 ENCOUNTER — Ambulatory Visit: Payer: PPO

## 2019-08-23 DIAGNOSIS — H353131 Nonexudative age-related macular degeneration, bilateral, early dry stage: Secondary | ICD-10-CM | POA: Diagnosis not present

## 2019-08-23 DIAGNOSIS — H2513 Age-related nuclear cataract, bilateral: Secondary | ICD-10-CM | POA: Diagnosis not present

## 2019-09-13 ENCOUNTER — Ambulatory Visit
Admission: RE | Admit: 2019-09-13 | Discharge: 2019-09-13 | Disposition: A | Discharge status: Home / Self Care | Payer: PPO | Source: Ambulatory Visit | Attending: Internal Medicine | Admitting: Internal Medicine

## 2019-09-13 ENCOUNTER — Ambulatory Visit: Payer: PPO | Attending: Internal Medicine

## 2019-09-13 DIAGNOSIS — Z23 Encounter for immunization: Secondary | ICD-10-CM | POA: Insufficient documentation

## 2019-09-13 DIAGNOSIS — Z1231 Encounter for screening mammogram for malignant neoplasm of breast: Secondary | ICD-10-CM | POA: Diagnosis not present

## 2019-09-13 NOTE — Progress Notes (Signed)
   Covid-19 Vaccination Clinic  Name:  Claire Bishop    MRN: [id number] DOB: [birth year]  09/13/2019  Claire Bishop was observed post Covid-19 immunization for 15 minutes without incident. She was provided with Vaccine Information Sheet and instruction to access the V-Safe system.   Claire Bishop was instructed to call 911 with any severe reactions post vaccine: Marland Kitchen Difficulty breathing  . Swelling of face and throat  . A fast heartbeat  . A bad rash all over body  . Dizziness and weakness   Immunizations Administered    Name Date Dose VIS Date Route   Pfizer COVID-19 Vaccine 09/13/2019  8:18 AM 0.3 mL 06/16/2019 Intramuscular   Manufacturer: [place]   Lot: [id number]   [place]: [id number]

## 2019-11-06 DIAGNOSIS — H02834 Dermatochalasis of left upper eyelid: Secondary | ICD-10-CM | POA: Diagnosis not present

## 2019-11-06 DIAGNOSIS — H02831 Dermatochalasis of right upper eyelid: Secondary | ICD-10-CM | POA: Diagnosis not present

## 2019-11-09 DIAGNOSIS — H02831 Dermatochalasis of right upper eyelid: Secondary | ICD-10-CM | POA: Diagnosis not present

## 2019-12-25 DIAGNOSIS — E538 Deficiency of other specified B group vitamins: Secondary | ICD-10-CM | POA: Diagnosis not present

## 2019-12-25 DIAGNOSIS — E782 Mixed hyperlipidemia: Secondary | ICD-10-CM | POA: Diagnosis not present

## 2019-12-25 DIAGNOSIS — E1169 Type 2 diabetes mellitus with other specified complication: Secondary | ICD-10-CM | POA: Diagnosis not present

## 2019-12-28 DIAGNOSIS — Z0000 Encounter for general adult medical examination without abnormal findings: Secondary | ICD-10-CM | POA: Diagnosis not present

## 2019-12-28 DIAGNOSIS — E1169 Type 2 diabetes mellitus with other specified complication: Secondary | ICD-10-CM | POA: Diagnosis not present

## 2019-12-28 DIAGNOSIS — E538 Deficiency of other specified B group vitamins: Secondary | ICD-10-CM | POA: Diagnosis not present

## 2019-12-28 DIAGNOSIS — E782 Mixed hyperlipidemia: Secondary | ICD-10-CM | POA: Diagnosis not present

## 2020-01-12 ENCOUNTER — Other Ambulatory Visit: Payer: Self-pay

## 2020-01-12 ENCOUNTER — Encounter: Payer: Self-pay | Admitting: Ophthalmology

## 2020-01-17 ENCOUNTER — Other Ambulatory Visit
Admission: RE | Admit: 2020-01-17 | Discharge: 2020-01-17 | Disposition: A | Discharge status: Home / Self Care | Payer: PPO | Source: Ambulatory Visit | Attending: Ophthalmology | Admitting: Ophthalmology

## 2020-01-17 ENCOUNTER — Other Ambulatory Visit: Payer: Self-pay

## 2020-01-17 ENCOUNTER — Other Ambulatory Visit: Payer: PPO

## 2020-01-17 DIAGNOSIS — Z20822 Contact with and (suspected) exposure to covid-19: Secondary | ICD-10-CM | POA: Insufficient documentation

## 2020-01-17 DIAGNOSIS — Z01812 Encounter for preprocedural laboratory examination: Secondary | ICD-10-CM | POA: Insufficient documentation

## 2020-01-17 LAB — SARS CORONAVIRUS 2 (TAT 6-24 HRS): SARS Coronavirus 2: NEGATIVE

## 2020-01-17 NOTE — Discharge Instructions (Signed)
INSTRUCTIONS FOLLOWING OCULOPLASTIC SURGERY AMY M. FOWLER, MD  AFTER YOUR EYE SURGERY, THER ARE MANY THINGS WHICH YOU, THE PATIENT, CAN DO TO ASSURE THE BEST POSSIBLE RESULT FROM YOUR OPERATION.  THIS SHEET SHOULD BE REFERRED TO WHENEVER QUESTIONS ARISE.  IF THERE ARE ANY QUESTIONS NOT ANSWERED HERE, DO NOT HESITATE TO CALL OUR OFFICE AT [phone] OR [phone].  THERE IS ALWAYS SOMEONE AVAILABLE TO CALL IF QUESTIONS OR PROBLEMS ARISE.  VISION: Your vision may be blurred and out of focus after surgery until you are able to stop using your ointment, swelling resolves and your eye(s) heal. This may take 1 to 2 weeks at the least.  If your vision becomes gradually more dim or dark, this is not normal and you need to call our office immediately.  EYE CARE: For the first 48 hours after surgery, use ice packs frequently - "20 minutes on, 20 minutes off" - to help reduce swelling and bruising.  Small bags of frozen peas or corn make good ice packs along with cloths soaked in ice water.  If you are wearing a patch or other type of dressing following surgery, keep this on for the amount of time specified by your doctor.  For the first week following surgery, you will need to treat your stitches with great care.  It is OK to shower, but take care to not allow soapy water to run into your eye(s) to help reduce chances of infection.  You may gently clean the eyelashes and around the eye(s) with cotton balls and sterile water, BUT DO NOT RUB THE STITCHES VIGOROUSLY.  Keeping your stitches moist with ointment will help promote healing with minimal scar formation.  ACTIVITY: When you leave the surgery center, you should go home, rest and be inactive.  The eye(s) may feel scratchy and keeping the eyes closed will allow for faster healing.  The first week following surgery, avoid straining (anything making the face turn red) or lifting over 20 pounds.  Additionally, avoid bending which causes your head to go below  your waist.  Using your eyes will NOT harm them, so feel free to read, watch television, use the computer, etc as desired.  Driving depends on each individual, so check with your doctor if you have questions about driving. Do not wear contact lenses for about 2 weeks.  Do not wear eye makeup for 2 weeks.  Avoid swimming, hot tubs, gardening, and dusting for 1 to 2 weeks to reduce the risk of an infection.  MEDICATIONS:  You will be given a prescription for an ointment to use 4 times a day on your stitches.  You can use the ointment in your eyes if they feel scratchy or irritated.  If you eyelid(s) don't close completely when you sleep, put some ointment in your eyes before bedtime.  EMERGENCY: If you experience SEVERE EYE PAIN OR HEADACHE UNRELIEVED BY TYLENOL OR TRAMADOL, NAUSEA OR VOMITING, WORSENING REDNESS, OR WORSENING VISION (ESPECIALLY VISION THAT WAS INITIALLY BETTER) CALL [phone] OR [phone] DURING BUSINESS HOURS OR AFTER HOURS.  General Anesthesia, Adult, Care After This sheet gives you information about how to care for yourself after your procedure. Your health care provider may also give you more specific instructions. If you have problems or questions, contact your health care provider. What can I expect after the procedure? After the procedure, the following side effects are common:  Pain or discomfort at the IV site.  Nausea.  Vomiting.  Sore throat.  Trouble concentrating.    Feeling cold or chills.  Weak or tired.  Sleepiness and fatigue.  Soreness and body aches. These side effects can affect parts of the body that were not involved in surgery. Follow these instructions at home:  For at least 24 hours after the procedure:  Have a responsible adult stay with you. It is important to have someone help care for you until you are awake and alert.  Rest as needed.  Do not: ? Participate in activities in which you could fall or become injured. ? Drive. ? Use  heavy machinery. ? Drink alcohol. ? Take sleeping pills or medicines that cause drowsiness. ? Make important decisions or sign legal documents. ? Take care of children on your own. Eating and drinking  Follow any instructions from your health care provider about eating or drinking restrictions.  When you feel hungry, start by eating small amounts of foods that are soft and easy to digest (bland), such as toast. Gradually return to your regular diet.  Drink enough fluid to keep your urine pale yellow.  If you vomit, rehydrate by drinking water, juice, or clear broth. General instructions  If you have sleep apnea, surgery and certain medicines can increase your risk for breathing problems. Follow instructions from your health care provider about wearing your sleep device: ? Anytime you are sleeping, including during daytime naps. ? While taking prescription pain medicines, sleeping medicines, or medicines that make you drowsy.  Return to your normal activities as told by your health care provider. Ask your health care provider what activities are safe for you.  Take over-the-counter and prescription medicines only as told by your health care provider.  If you smoke, do not smoke without supervision.  Keep all follow-up visits as told by your health care provider. This is important. Contact a health care provider if:  You have nausea or vomiting that does not get better with medicine.  You cannot eat or drink without vomiting.  You have pain that does not get better with medicine.  You are unable to pass urine.  You develop a skin rash.  You have a fever.  You have redness around your IV site that gets worse. Get help right away if:  You have difficulty breathing.  You have chest pain.  You have blood in your urine or stool, or you vomit blood. Summary  After the procedure, it is common to have a sore throat or nausea. It is also common to feel tired.  Have a  responsible adult stay with you for the first 24 hours after general anesthesia. It is important to have someone help care for you until you are awake and alert.  When you feel hungry, start by eating small amounts of foods that are soft and easy to digest (bland), such as toast. Gradually return to your regular diet.  Drink enough fluid to keep your urine pale yellow.  Return to your normal activities as told by your health care provider. Ask your health care provider what activities are safe for you. This information is not intended to replace advice given to you by your health care provider. Make sure you discuss any questions you have with your health care provider. Document Revised: 06/25/2017 Document Reviewed: 02/05/2017 Elsevier Patient Education  2020 [employer]  

## 2020-01-19 ENCOUNTER — Ambulatory Visit: Payer: PPO | Admitting: Anesthesiology

## 2020-01-19 ENCOUNTER — Encounter
Admission: RE | Disposition: A | Discharge status: Home / Self Care | Payer: Self-pay | Source: Home / Self Care | Attending: Ophthalmology

## 2020-01-19 ENCOUNTER — Ambulatory Visit
Admission: RE | Admit: 2020-01-19 | Discharge: 2020-01-19 | Disposition: A | Discharge status: Home / Self Care | Payer: PPO | Attending: Ophthalmology | Admitting: Ophthalmology

## 2020-01-19 ENCOUNTER — Encounter: Payer: Self-pay | Admitting: Ophthalmology

## 2020-01-19 DIAGNOSIS — Z7982 Long term (current) use of aspirin: Secondary | ICD-10-CM | POA: Diagnosis not present

## 2020-01-19 DIAGNOSIS — E119 Type 2 diabetes mellitus without complications: Secondary | ICD-10-CM | POA: Insufficient documentation

## 2020-01-19 DIAGNOSIS — Z87891 Personal history of nicotine dependence: Secondary | ICD-10-CM | POA: Diagnosis not present

## 2020-01-19 DIAGNOSIS — H02831 Dermatochalasis of right upper eyelid: Secondary | ICD-10-CM | POA: Diagnosis not present

## 2020-01-19 DIAGNOSIS — K219 Gastro-esophageal reflux disease without esophagitis: Secondary | ICD-10-CM | POA: Diagnosis not present

## 2020-01-19 DIAGNOSIS — I10 Essential (primary) hypertension: Secondary | ICD-10-CM | POA: Diagnosis not present

## 2020-01-19 DIAGNOSIS — H02834 Dermatochalasis of left upper eyelid: Secondary | ICD-10-CM | POA: Insufficient documentation

## 2020-01-19 DIAGNOSIS — Z79899 Other long term (current) drug therapy: Secondary | ICD-10-CM | POA: Diagnosis not present

## 2020-01-19 HISTORY — PX: BROW LIFT: SHX178

## 2020-01-19 SURGERY — BLEPHAROPLASTY
Anesthesia: Monitor Anesthesia Care | Site: Eye | Laterality: Bilateral

## 2020-01-19 MED ORDER — TETRACAINE HCL 0.5 % OP SOLN
OPHTHALMIC | Status: DC | PRN
  Administered 2020-01-19: 2 [drp] via OPHTHALMIC

## 2020-01-19 MED ORDER — LIDOCAINE-EPINEPHRINE 2 %-1:100000 IJ SOLN
INTRAMUSCULAR | Status: DC | PRN
  Administered 2020-01-19: 2 mL via OPHTHALMIC

## 2020-01-19 MED ORDER — FENTANYL CITRATE (PF) 100 MCG/2ML IJ SOLN: 25.0000 ug | INTRAMUSCULAR | Status: DC | PRN

## 2020-01-19 MED ORDER — ACETAMINOPHEN 325 MG PO TABS: 325.0000 mg | ORAL_TABLET | ORAL | Status: DC | PRN

## 2020-01-19 MED ORDER — LACTATED RINGERS IV SOLN: INTRAVENOUS | Status: DC

## 2020-01-19 MED ORDER — ACETAMINOPHEN-CODEINE #3 300-30 MG PO TABS: 1.0000 | ORAL_TABLET | ORAL | 0 refills | Status: DC | PRN

## 2020-01-19 MED ORDER — ONDANSETRON HCL 4 MG/2ML IJ SOLN: 4.0000 mg | Freq: Once | INTRAMUSCULAR | Status: DC | PRN

## 2020-01-19 MED ORDER — ERYTHROMYCIN 5 MG/GM OP OINT
TOPICAL_OINTMENT | OPHTHALMIC | 2 refills | Status: DC
Start: 2020-01-19 — End: 2023-03-31

## 2020-01-19 MED ORDER — OXYCODONE HCL 5 MG PO TABS: 5.0000 mg | ORAL_TABLET | Freq: Once | ORAL | Status: DC | PRN

## 2020-01-19 MED ORDER — MIDAZOLAM HCL 2 MG/2ML IJ SOLN
INTRAMUSCULAR | Status: DC | PRN
  Administered 2020-01-19: 1 mg via INTRAVENOUS

## 2020-01-19 MED ORDER — ALFENTANIL 500 MCG/ML IJ INJ
INJECTION | INTRAVENOUS | Status: DC | PRN
  Administered 2020-01-19: 400 ug via INTRAVENOUS

## 2020-01-19 MED ORDER — BSS IO SOLN
INTRAOCULAR | Status: DC | PRN
  Administered 2020-01-19: 15 mL

## 2020-01-19 MED ORDER — ACETAMINOPHEN 160 MG/5ML PO SOLN: 325.0000 mg | ORAL | Status: DC | PRN

## 2020-01-19 MED ORDER — ERYTHROMYCIN 5 MG/GM OP OINT
TOPICAL_OINTMENT | OPHTHALMIC | Status: DC | PRN
  Administered 2020-01-19: 1 via OPHTHALMIC

## 2020-01-19 MED ORDER — OXYCODONE HCL 5 MG/5ML PO SOLN: 5.0000 mg | Freq: Once | ORAL | Status: DC | PRN

## 2020-01-19 SURGICAL SUPPLY — 21 items
APPLICATOR COTTON TIP WD 3 STR (MISCELLANEOUS) ×3 IMPLANT
CORD BIP STRL DISP 12FT (MISCELLANEOUS) ×3 IMPLANT
DRAPE HEAD BAR (DRAPES) ×3 IMPLANT
GAUZE SPONGE 4X4 12PLY STRL (GAUZE/BANDAGES/DRESSINGS) ×3 IMPLANT
GLOVE SURG LX 7.0 MICRO (GLOVE) ×4
GLOVE SURG LX STRL 7.0 MICRO (GLOVE) ×2 IMPLANT
GOWN STRL REUS W/ TWL LRG LVL3 (GOWN DISPOSABLE) ×1 IMPLANT
GOWN STRL REUS W/TWL LRG LVL3 (GOWN DISPOSABLE) ×3
MARKER SKIN XFINE TIP W/RULER (MISCELLANEOUS) ×3 IMPLANT
NEEDLE FILTER BLUNT 18X 1/2SAF (NEEDLE) ×2
NEEDLE FILTER BLUNT 18X1 1/2 (NEEDLE) ×1 IMPLANT
NEEDLE HYPO 30X.5 LL (NEEDLE) ×6 IMPLANT
PACK ENT CUSTOM (PACKS) ×3 IMPLANT
SOL PREP PVP 2OZ (MISCELLANEOUS) ×3
SOLUTION PREP PVP 2OZ (MISCELLANEOUS) ×1 IMPLANT
SPONGE GAUZE 2X2 8PLY STER LF (GAUZE/BANDAGES/DRESSINGS) ×5
SPONGE GAUZE 2X2 8PLY STRL LF (GAUZE/BANDAGES/DRESSINGS) ×10 IMPLANT
SUT GUT PLAIN 6-0 1X18 ABS (SUTURE) ×3 IMPLANT
SYR 10ML LL (SYRINGE) ×3 IMPLANT
SYR 3ML LL SCALE MARK (SYRINGE) ×3 IMPLANT
WATER STERILE IRR 250ML POUR (IV SOLUTION) ×3 IMPLANT

## 2020-01-19 NOTE — Anesthesia Postprocedure Evaluation (Signed)
Anesthesia Post Note  Patient: Claire Bishop  Procedure(s) Performed: BLEPHAROPLASTY UPPER EYELID; W/EXCESS SKIN BILATERAL DIABETIC (Bilateral Eye)     Patient location during evaluation: PACU Anesthesia Type: MAC Level of consciousness: awake and alert and oriented Pain management: satisfactory to patient Vital Signs Assessment: post-procedure vital signs reviewed and stable Respiratory status: spontaneous breathing, nonlabored ventilation and respiratory function stable Cardiovascular status: blood pressure returned to baseline and stable Postop Assessment: Adequate PO intake and No signs of nausea or vomiting Anesthetic complications: no   No complications documented.  Raliegh Ip

## 2020-01-19 NOTE — Op Note (Signed)
Preoperative Diagnosis:  Visually significant dermatochalasis bilateral  Upper Eyelid(s)  Postoperative Diagnosis:  Same.  Procedure(s) Performed:   Upper eyelid blepharoplasty with excess skin excision  bilateral  Upper Eyelid(s)  Surgeon: Philis Pique. Vickki Muff, M.D.  Assistants: none  Anesthesia: MAC  Specimens: None.  Estimated Blood Loss: Minimal.  Complications: None.  Operative Findings: None Dictated  Procedure:   Allergies were reviewed and the patient is allergic to Ultram [tramadol].   After the risks, benefits, complications and alternatives were discussed with the patient, appropriate informed consent was obtained and the patient was brought to the operating suite. The patient was reclined supine and a timeout was conducted.  The patient was then sedated.  Local anesthetic consisting of a 50-50 mixture of 2% lidocaine with epinephrine and 0.75% bupivacaine with added Hylenex was injected subcutaneously to both  upper eyelid(s). After adequate local was instilled, the patient was prepped and draped in the usual sterile fashion for eyelid surgery.   Attention was turned to the upper eyelids. A 24m upper eyelid crease incision line was marked with calipers on both  upper eyelid(s).  A pinch test was used to estimate the amount of excess skin to remove and this was marked in standard blepharoplasty style fashion. Attention was turned to the  right  upper eyelid. A #15 blade was used to open the premarked incision line. A Skin only flap was excised and hemostasis was obtained with bipolar cautery.   Attention was then turned to the opposite eyelid where the same procedure was performed in the same manner. Hemostasis was obtained with bipolar cautery throughout. All incisions were then closed with a combination of running and interrupted 6-0 fast absorbing plain suture. The patient tolerated the procedure well.  Erythromycin ophthalmic Ophthalmic ointment was applied to her incision  sites, followed by ice packs. She was taken to the recovery area where she recovered without difficulty.  Post-Op Plan/Instructions:  The patient was instructed to use ice packs frequently for the next 48 hours. She was instructed to use Erythromycin ophthalmic ophthalmic ointment on her incisions 4 times a day for the next 12 to 14 days. She was given a prescription for tramadol (or similar) for pain control should Tylenol not be effective. She was [facility] [place] [facility] [place] needed [facility] problems.  Tonae Livolsi M. FVickki Muff M.D. Ophthalmology

## 2020-01-19 NOTE — Interval H&P Note (Signed)
History and Physical Interval Note:  01/19/2020 7:35 AM  Claire Bishop  has presented today for surgery, with the diagnosis of H02.831 Dermatochalasis of Right Upper Eyelid H02.834 Dermatochalasis of Left Upper Eyelid.  The various methods of treatment have been discussed with the patient and family. After consideration of risks, benefits and other options for treatment, the patient has consented to  Procedure(s): BLEPHAROPLASTY UPPER EYELID; W/EXCESS SKIN BILATERAL DIABETIC (Bilateral) as a surgical intervention.  The patient's history has been reviewed, patient examined, no change in status, stable for surgery.  I have reviewed the patient's chart and labs.  Questions were answered to the patient's satisfaction.     Vickki Muff, Danielys Madry M

## 2020-01-19 NOTE — Anesthesia Preprocedure Evaluation (Signed)
Anesthesia Evaluation  Patient identified by MRN, date of birth, ID band Patient awake    Reviewed: Allergy & Precautions, H&P , NPO status , Patient's Chart, lab work & pertinent test results  Airway Mallampati: II  TM Distance: >3 FB Neck ROM: full    Dental no notable dental hx.    Pulmonary former smoker,    Pulmonary exam normal breath sounds clear to auscultation       Cardiovascular hypertension, Normal cardiovascular exam Rhythm:regular Rate:Normal     Neuro/Psych    GI/Hepatic GERD  ,  Endo/Other    Renal/GU      Musculoskeletal   Abdominal   Peds  Hematology   Anesthesia Other Findings   Reproductive/Obstetrics                             Anesthesia Physical Anesthesia Plan  ASA: II  Anesthesia Plan: MAC   Post-op Pain Management:    Induction:   PONV Risk Score and Plan: 2 and Treatment may vary due to age or medical condition, TIVA, Midazolam and Ondansetron  Airway Management Planned:   Additional Equipment:   Intra-op Plan:   Post-operative Plan:   Informed Consent: I have reviewed the patients History and Physical, chart, labs and discussed the procedure including the risks, benefits and alternatives for the proposed anesthesia with the patient or authorized representative who has indicated his/her understanding and acceptance.     Dental Advisory Given  Plan Discussed with: CRNA  Anesthesia Plan Comments:         Anesthesia Quick Evaluation

## 2020-01-19 NOTE — Anesthesia Procedure Notes (Signed)
Performed by: Myisha Pickerel, CRNA Pre-anesthesia Checklist: Patient identified, Emergency Drugs available, Suction available, Timeout performed and Patient being monitored Patient Re-evaluated:Patient Re-evaluated prior to induction Oxygen Delivery Method: Nasal cannula Placement Confirmation: positive ETCO2       

## 2020-01-19 NOTE — Transfer of Care (Signed)
Immediate Anesthesia Transfer of Care Note  Patient: Claire Bishop  Procedure(s) Performed: BLEPHAROPLASTY UPPER EYELID; W/EXCESS SKIN BILATERAL DIABETIC (Bilateral Eye)  Patient Location: PACU  Anesthesia Type: MAC  Level of Consciousness: awake, alert  and patient cooperative  Airway and Oxygen Therapy: Patient Spontanous Breathing and Patient connected to supplemental oxygen  Post-op Assessment: Post-op Vital signs reviewed, Patient's Cardiovascular Status Stable, Respiratory Function Stable, Patent Airway and No signs of Nausea or vomiting  Post-op Vital Signs: Reviewed and stable  Complications: No complications documented.

## 2020-01-19 NOTE — H&P (Signed)
See the history and physical completed at [facility] on 12/25/19 and scanned into the chart.

## 2020-01-22 ENCOUNTER — Encounter: Payer: Self-pay | Admitting: Ophthalmology

## 2020-05-10 DIAGNOSIS — L732 Hidradenitis suppurativa: Secondary | ICD-10-CM | POA: Diagnosis not present

## 2020-05-10 DIAGNOSIS — M76899 Other specified enthesopathies of unspecified lower limb, excluding foot: Secondary | ICD-10-CM | POA: Diagnosis not present

## 2020-06-05 DIAGNOSIS — L570 Actinic keratosis: Secondary | ICD-10-CM | POA: Diagnosis not present

## 2020-06-05 DIAGNOSIS — D2262 Melanocytic nevi of left upper limb, including shoulder: Secondary | ICD-10-CM | POA: Diagnosis not present

## 2020-06-05 DIAGNOSIS — L821 Other seborrheic keratosis: Secondary | ICD-10-CM | POA: Diagnosis not present

## 2020-06-05 DIAGNOSIS — D225 Melanocytic nevi of trunk: Secondary | ICD-10-CM | POA: Diagnosis not present

## 2020-06-05 DIAGNOSIS — L814 Other melanin hyperpigmentation: Secondary | ICD-10-CM | POA: Diagnosis not present

## 2020-06-05 DIAGNOSIS — L218 Other seborrheic dermatitis: Secondary | ICD-10-CM | POA: Diagnosis not present

## 2020-06-05 DIAGNOSIS — L309 Dermatitis, unspecified: Secondary | ICD-10-CM | POA: Diagnosis not present

## 2020-06-05 DIAGNOSIS — D2271 Melanocytic nevi of right lower limb, including hip: Secondary | ICD-10-CM | POA: Diagnosis not present

## 2020-06-05 DIAGNOSIS — D1801 Hemangioma of skin and subcutaneous tissue: Secondary | ICD-10-CM | POA: Diagnosis not present

## 2020-06-05 DIAGNOSIS — C44612 Basal cell carcinoma of skin of right upper limb, including shoulder: Secondary | ICD-10-CM | POA: Diagnosis not present

## 2020-06-27 DIAGNOSIS — E1169 Type 2 diabetes mellitus with other specified complication: Secondary | ICD-10-CM | POA: Diagnosis not present

## 2020-06-27 DIAGNOSIS — E782 Mixed hyperlipidemia: Secondary | ICD-10-CM | POA: Diagnosis not present

## 2020-06-27 DIAGNOSIS — E538 Deficiency of other specified B group vitamins: Secondary | ICD-10-CM | POA: Diagnosis not present

## 2020-07-16 DIAGNOSIS — E782 Mixed hyperlipidemia: Secondary | ICD-10-CM | POA: Diagnosis not present

## 2020-07-16 DIAGNOSIS — Z0000 Encounter for general adult medical examination without abnormal findings: Secondary | ICD-10-CM | POA: Diagnosis not present

## 2020-07-16 DIAGNOSIS — E538 Deficiency of other specified B group vitamins: Secondary | ICD-10-CM | POA: Diagnosis not present

## 2020-07-16 DIAGNOSIS — M810 Age-related osteoporosis without current pathological fracture: Secondary | ICD-10-CM | POA: Diagnosis not present

## 2020-07-16 DIAGNOSIS — E1169 Type 2 diabetes mellitus with other specified complication: Secondary | ICD-10-CM | POA: Diagnosis not present

## 2020-08-26 DIAGNOSIS — H2513 Age-related nuclear cataract, bilateral: Secondary | ICD-10-CM | POA: Diagnosis not present

## 2020-10-07 ENCOUNTER — Other Ambulatory Visit: Payer: Self-pay | Admitting: Internal Medicine

## 2020-10-07 DIAGNOSIS — Z1231 Encounter for screening mammogram for malignant neoplasm of breast: Secondary | ICD-10-CM

## 2020-10-15 ENCOUNTER — Ambulatory Visit
Admission: RE | Admit: 2020-10-15 | Discharge: 2020-10-15 | Disposition: A | Discharge status: Home / Self Care | Payer: PPO | Source: Ambulatory Visit | Attending: Internal Medicine | Admitting: Internal Medicine

## 2020-10-15 ENCOUNTER — Other Ambulatory Visit: Payer: Self-pay

## 2020-10-15 DIAGNOSIS — Z1231 Encounter for screening mammogram for malignant neoplasm of breast: Secondary | ICD-10-CM | POA: Diagnosis not present

## 2020-11-05 DIAGNOSIS — E782 Mixed hyperlipidemia: Secondary | ICD-10-CM | POA: Diagnosis not present

## 2020-11-05 DIAGNOSIS — E1169 Type 2 diabetes mellitus with other specified complication: Secondary | ICD-10-CM | POA: Diagnosis not present

## 2020-11-05 DIAGNOSIS — H6123 Impacted cerumen, bilateral: Secondary | ICD-10-CM | POA: Diagnosis not present

## 2020-12-31 DIAGNOSIS — E538 Deficiency of other specified B group vitamins: Secondary | ICD-10-CM | POA: Diagnosis not present

## 2020-12-31 DIAGNOSIS — E782 Mixed hyperlipidemia: Secondary | ICD-10-CM | POA: Diagnosis not present

## 2020-12-31 DIAGNOSIS — E1169 Type 2 diabetes mellitus with other specified complication: Secondary | ICD-10-CM | POA: Diagnosis not present

## 2021-01-14 DIAGNOSIS — E538 Deficiency of other specified B group vitamins: Secondary | ICD-10-CM | POA: Diagnosis not present

## 2021-01-14 DIAGNOSIS — E1169 Type 2 diabetes mellitus with other specified complication: Secondary | ICD-10-CM | POA: Diagnosis not present

## 2021-01-14 DIAGNOSIS — M1711 Unilateral primary osteoarthritis, right knee: Secondary | ICD-10-CM | POA: Diagnosis not present

## 2021-01-14 DIAGNOSIS — Z0000 Encounter for general adult medical examination without abnormal findings: Secondary | ICD-10-CM | POA: Diagnosis not present

## 2021-01-14 DIAGNOSIS — E782 Mixed hyperlipidemia: Secondary | ICD-10-CM | POA: Diagnosis not present

## 2021-05-01 DIAGNOSIS — M1711 Unilateral primary osteoarthritis, right knee: Secondary | ICD-10-CM | POA: Diagnosis not present

## 2021-05-19 DIAGNOSIS — M4726 Other spondylosis with radiculopathy, lumbar region: Secondary | ICD-10-CM | POA: Diagnosis not present

## 2021-05-19 DIAGNOSIS — M25552 Pain in left hip: Secondary | ICD-10-CM | POA: Diagnosis not present

## 2021-05-19 DIAGNOSIS — M1652 Unilateral post-traumatic osteoarthritis, left hip: Secondary | ICD-10-CM | POA: Diagnosis not present

## 2021-06-09 DIAGNOSIS — D225 Melanocytic nevi of trunk: Secondary | ICD-10-CM | POA: Diagnosis not present

## 2021-06-09 DIAGNOSIS — D485 Neoplasm of uncertain behavior of skin: Secondary | ICD-10-CM | POA: Diagnosis not present

## 2021-06-09 DIAGNOSIS — L814 Other melanin hyperpigmentation: Secondary | ICD-10-CM | POA: Diagnosis not present

## 2021-06-09 DIAGNOSIS — L821 Other seborrheic keratosis: Secondary | ICD-10-CM | POA: Diagnosis not present

## 2021-06-09 DIAGNOSIS — L570 Actinic keratosis: Secondary | ICD-10-CM | POA: Diagnosis not present

## 2021-06-09 DIAGNOSIS — D1801 Hemangioma of skin and subcutaneous tissue: Secondary | ICD-10-CM | POA: Diagnosis not present

## 2021-06-09 DIAGNOSIS — Z85828 Personal history of other malignant neoplasm of skin: Secondary | ICD-10-CM | POA: Diagnosis not present

## 2021-06-17 DIAGNOSIS — R350 Frequency of micturition: Secondary | ICD-10-CM | POA: Diagnosis not present

## 2021-06-24 NOTE — Discharge Instructions (Signed)
Instructions after Total Knee Replacement   Claire Bishop, Jr., M.D.     Dept. of Orthopaedics & Sports Medicine  [facility]  [address]  [place], [place]  [address]  Phone: [phone]   Fax: [phone]    DIET: Drink plenty of non-alcoholic fluids. Resume your normal diet. Include foods high in fiber.  ACTIVITY:  You may use crutches or a walker with weight-bearing as tolerated, unless instructed otherwise. You may be weaned off of the walker or crutches by your Physical Therapist.  Do NOT place pillows under the knee. Anything placed under the knee could limit your ability to straighten the knee.   Continue doing gentle exercises. Exercising will reduce the pain and swelling, increase motion, and prevent muscle weakness.   Please continue to use the TED compression stockings for 6 weeks. You may remove the stockings at night, but should reapply them in the morning. Do not drive or operate any equipment until instructed.  WOUND CARE:  Continue to use the PolarCare or ice packs periodically to reduce pain and swelling. You may bathe or shower after the staples are removed at the first office visit following surgery.  MEDICATIONS: You may resume your regular medications. Please take the pain medication as prescribed on the medication. Do not take pain medication on an empty stomach. You have been given a prescription for a blood thinner (Lovenox or Coumadin). Please take the medication as instructed. (NOTE: After completing a 2 week course of Lovenox, take one Enteric-coated aspirin once a day. This along with elevation will help reduce the possibility of phlebitis in your operated leg.) Do not drive or drink alcoholic beverages when taking pain medications.  CALL THE OFFICE FOR: Temperature above 101 degrees Excessive bleeding or drainage on the dressing. Excessive swelling, coldness, or paleness of the toes. Persistent nausea and vomiting.  FOLLOW-UP:  You  should have an appointment to return to the office in 10-14 days after surgery. Arrangements have been made for continuation of Physical Therapy (either home therapy or outpatient therapy).   [facility] Department Directory         [email or web address]       [email or web address]          Cardiology  Appointments: [place] - [phone] [place] - [phone]  Endocrinology  Appointments: [place] - [phone] [place] - [phone]  Gastroenterology  Appointments: [place] - [phone] [place] - [phone]        General Surgery   Appointments: [place] - [phone]  Internal Medicine/Family Medicine  Appointments: [place] - [phone] [place] - [phone] [place] - [phone]  Metabolic and Weigh Loss Surgery  Appointments: [place] - [phone]        Neurology  Appointments: [place] - [phone] [place] - [phone]  Neurosurgery  Appointments: [place] - [phone]  Obstetrics & Gynecology  Appointments: [place] - [phone] [place] - [phone]        Pediatrics  Appointments: [place] - [phone] [place] - [phone]  Physiatry  Appointments: [place] -[phone]  Physical Therapy  Appointments: [place] - [phone] [place] - [phone]        Podiatry  Appointments: [place] - [phone] [place] - [phone]  Pulmonology  Appointments: [place] - [phone]  Rheumatology  Appointments: [place] - [phone]        [place] Location: [facility]  [address] [place], [place]  [address]  [place] Location: [facility] [address] [place], [place]  [address]  [place] Location: [facility] [address] [place], [place]  [address]    

## 2021-06-25 ENCOUNTER — Other Ambulatory Visit: Payer: PPO

## 2021-06-25 ENCOUNTER — Other Ambulatory Visit: Payer: Self-pay

## 2021-06-25 ENCOUNTER — Encounter
Admission: RE | Admit: 2021-06-25 | Discharge: 2021-06-25 | Disposition: A | Discharge status: Home / Self Care | Payer: PPO | Source: Ambulatory Visit | Attending: Orthopedic Surgery | Admitting: Orthopedic Surgery

## 2021-06-25 VITALS — BP 156/94 | HR 71 | Temp 97.3°F | Resp 20 | Ht 64.0 in | Wt 162.0 lb

## 2021-06-25 DIAGNOSIS — E119 Type 2 diabetes mellitus without complications: Secondary | ICD-10-CM | POA: Diagnosis not present

## 2021-06-25 DIAGNOSIS — Z01810 Encounter for preprocedural cardiovascular examination: Secondary | ICD-10-CM | POA: Diagnosis not present

## 2021-06-25 DIAGNOSIS — Z01812 Encounter for preprocedural laboratory examination: Secondary | ICD-10-CM

## 2021-06-25 DIAGNOSIS — M1711 Unilateral primary osteoarthritis, right knee: Secondary | ICD-10-CM | POA: Diagnosis not present

## 2021-06-25 DIAGNOSIS — Z01818 Encounter for other preprocedural examination: Secondary | ICD-10-CM | POA: Insufficient documentation

## 2021-06-25 HISTORY — DX: Prediabetes: R73.03

## 2021-06-25 LAB — COMPREHENSIVE METABOLIC PANEL
ALT: 12 U/L (ref 0–44)
AST: 14 U/L — ABNORMAL LOW (ref 15–41)
Albumin: 4 g/dL (ref 3.5–5.0)
Alkaline Phosphatase: 82 U/L (ref 38–126)
Anion gap: 7 (ref 5–15)
BUN: 14 mg/dL (ref 8–23)
CO2: 28 mmol/L (ref 22–32)
Calcium: 9.2 mg/dL (ref 8.9–10.3)
Chloride: 105 mmol/L (ref 98–111)
Creatinine, Ser: 0.58 mg/dL (ref 0.44–1.00)
GFR, Estimated: >60 mL/min (ref >60)
Glucose, Bld: 101 mg/dL — ABNORMAL HIGH (ref 70–99)
Potassium: 3.7 mmol/L (ref 3.5–5.1)
Sodium: 140 mmol/L (ref 135–145)
Total Bilirubin: 0.7 mg/dL (ref 0.3–1.2)
Total Protein: 6.9 g/dL (ref 6.5–8.1)

## 2021-06-25 LAB — URINALYSIS, ROUTINE W REFLEX MICROSCOPIC
Bilirubin Urine: NEGATIVE
Glucose, UA: NEGATIVE mg/dL
Ketones, ur: NEGATIVE mg/dL
Leukocytes,Ua: NEGATIVE
Nitrite: NEGATIVE
Protein, ur: NEGATIVE mg/dL
Specific Gravity, Urine: 1.02 (ref 1.005–1.030)
pH: 7 (ref 5.0–8.0)

## 2021-06-25 LAB — SEDIMENTATION RATE: Sed Rate: 9 mm/hr (ref 0–30)

## 2021-06-25 LAB — PROTIME-INR
INR: 0.9 (ref 0.8–1.2)
Prothrombin Time: 11.7 seconds (ref 11.4–15.2)

## 2021-06-25 LAB — URINALYSIS, MICROSCOPIC (REFLEX): Bacteria, UA: NONE SEEN

## 2021-06-25 LAB — TYPE AND SCREEN
ABO/RH(D): A POS
Antibody Screen: NEGATIVE

## 2021-06-25 LAB — CBC
HCT: 42.2 % (ref 36.0–46.0)
Hemoglobin: 13.7 g/dL (ref 12.0–15.0)
MCH: 28.4 pg (ref 26.0–34.0)
MCHC: 32.5 g/dL (ref 30.0–36.0)
MCV: 87.6 fL (ref 80.0–100.0)
Platelets: 252 10*3/uL (ref 150–400)
RBC: 4.82 MIL/uL (ref 3.87–5.11)
RDW: 13.2 % (ref 11.5–15.5)
WBC: 5.8 10*3/uL (ref 4.0–10.5)
nRBC: 0 % (ref 0.0–0.2)

## 2021-06-25 LAB — SURGICAL PCR SCREEN
MRSA, PCR: NEGATIVE
Staphylococcus aureus: NEGATIVE

## 2021-06-25 LAB — APTT: aPTT: 26 seconds (ref 24–36)

## 2021-06-25 LAB — C-REACTIVE PROTEIN: CRP: 0.6 mg/dL (ref <1.0)

## 2021-06-25 NOTE — Patient Instructions (Addendum)
Your procedure is scheduled on: Wednesday July 09, 2020. Report to Day Surgery inside [facility] 2nd floor. To find out your arrival time please call [phone] between 1PM - 3PM on Tuesday July 08, 2020.  Remember: Instructions that are not followed completely may result in serious medical risk,  up to and including death, or upon the discretion of your surgeon and anesthesiologist your  surgery may need to be rescheduled.     _X__ 1. Do not eat food after midnight the night before your procedure.                 No chewing gum or hard candies. You may drink clear liquids up to 2 hours                 before you are scheduled to arrive for your surgery- DO not drink clear                 liquids within 2 hours of the start of your surgery.                 Clear Liquids include:  water, apple juice without pulp, clear Gatorade, G2 or                  Gatorade Zero (avoid Red/Purple/Blue), Black Coffee or Tea (Do not add                 anything to coffee or tea).  __X__2.   Complete the "Ensure Clear Pre-surgery Clear Carbohydrate Drink" provided to you, 2 hours before arrival. **If you are diabetic you will be provided with an alternative drink, Gatorade Zero or G2.  __X__3.  On the morning of surgery brush your teeth with toothpaste and water, you                may rinse your mouth with mouthwash if you wish.  Do not swallow any toothpaste of mouthwash.     _X__ 4.  No Alcohol for 24 hours before or after surgery.   _X__ 5.  Do Not Smoke or use e-cigarettes For 24 Hours Prior to Your Surgery.                 Do not use any chewable tobacco products for at least 6 hours prior to                 Surgery.  _X__  6.  Do not use any recreational drugs (marijuana, cocaine, heroin, ecstasy, MDMA or other)                For at least one week prior to your surgery.  Combination of these drugs with anesthesia                May have life threatening  results  __X__7.  Notify your doctor if there is any change in your medical condition      (cold, fever, infections).     Do not wear jewelry, make-up, hairpins, clips or nail polish. Do not wear lotions, powders, or perfumes. You may wear deodorant. Do not shave 48 hours prior to surgery. Men may shave face and neck. Do not bring valuables to the hospital.    [facility] is not responsible for any belongings or valuables.  Contacts, dentures or bridgework may not be worn into surgery. Leave your suitcase in the car. After surgery it may be brought to your room. For patients admitted  to the hospital, discharge time is determined by your treatment team.   Patients discharged the day of surgery will not be allowed to drive home.   Make arrangements for someone to be with you for the first 24 hours of your Same Day Discharge.    Please read over the following fact sheets that you were given:   Total Joint Packet    __X__ Take these medicines the morning of surgery with A SIP OF WATER:    1. pantoprazole (PROTONIX) 40 MG  2.   3.   4.  5.  6.  ____ Fleet Enema (as directed)   __X__ Use CHG Soap (or wipes) as directed  ____ Use Benzoyl Peroxide Gel as instructed  ____ Use inhalers on the day of surgery  ____ Stop metformin 2 days prior to surgery    ____ Take 1/2 of usual insulin dose the night before surgery. No insulin the morning          of surgery.   ____ Call your PCP, cardiologist, or Pulmonologist if taking Coumadin/Plavix/aspirin and ask when to stop before your surgery.   __X__ One Week prior to surgery- Stop Anti-inflammatories such as Ibuprofen, Aleve, Advil, Motrin, meloxicam (MOBIC), diclofenac, etodolac, ketorolac, Toradol, Daypro, piroxicam, Goody's or BC powders. OK TO USE TYLENOL IF NEEDED   __X__ One week prior to surgery stop ALL supplements until after surgery.    ____ Bring C-Pap to the hospital.    If you have any questions regarding your  pre-procedure instructions,  Please call Pre-admit Testing at [phone]

## 2021-06-26 LAB — HEMOGLOBIN A1C
Hgb A1c MFr Bld: 6.3 % — ABNORMAL HIGH (ref 4.8–5.6)
Mean Plasma Glucose: 134 mg/dL

## 2021-07-08 ENCOUNTER — Other Ambulatory Visit
Admission: RE | Admit: 2021-07-08 | Discharge: 2021-07-08 | Disposition: A | Discharge status: Home / Self Care | Payer: PPO | Source: Ambulatory Visit | Attending: Orthopedic Surgery | Admitting: Orthopedic Surgery

## 2021-07-08 DIAGNOSIS — M1711 Unilateral primary osteoarthritis, right knee: Secondary | ICD-10-CM | POA: Diagnosis not present

## 2021-07-08 DIAGNOSIS — Z20822 Contact with and (suspected) exposure to covid-19: Secondary | ICD-10-CM | POA: Diagnosis not present

## 2021-07-08 DIAGNOSIS — E119 Type 2 diabetes mellitus without complications: Secondary | ICD-10-CM | POA: Diagnosis not present

## 2021-07-08 DIAGNOSIS — Z01818 Encounter for other preprocedural examination: Secondary | ICD-10-CM

## 2021-07-08 DIAGNOSIS — Z87891 Personal history of nicotine dependence: Secondary | ICD-10-CM | POA: Diagnosis not present

## 2021-07-08 LAB — SARS CORONAVIRUS 2 (TAT 6-24 HRS): SARS Coronavirus 2: NEGATIVE

## 2021-07-09 ENCOUNTER — Ambulatory Visit: Payer: PPO | Admitting: Certified Registered"

## 2021-07-09 ENCOUNTER — Other Ambulatory Visit: Payer: Self-pay

## 2021-07-09 ENCOUNTER — Observation Stay: Payer: PPO

## 2021-07-09 ENCOUNTER — Encounter: Payer: Self-pay | Admitting: Orthopedic Surgery

## 2021-07-09 ENCOUNTER — Observation Stay
Admission: RE | Admit: 2021-07-09 | Discharge: 2021-07-10 | Disposition: A | Discharge status: Home / Self Care | Payer: PPO | Attending: Orthopedic Surgery | Admitting: Orthopedic Surgery

## 2021-07-09 ENCOUNTER — Encounter
Admission: RE | Disposition: A | Discharge status: Home / Self Care | Payer: Self-pay | Source: Home / Self Care | Attending: Orthopedic Surgery

## 2021-07-09 DIAGNOSIS — M1711 Unilateral primary osteoarthritis, right knee: Principal | ICD-10-CM | POA: Insufficient documentation

## 2021-07-09 DIAGNOSIS — E119 Type 2 diabetes mellitus without complications: Secondary | ICD-10-CM | POA: Insufficient documentation

## 2021-07-09 DIAGNOSIS — E782 Mixed hyperlipidemia: Secondary | ICD-10-CM | POA: Diagnosis not present

## 2021-07-09 DIAGNOSIS — M810 Age-related osteoporosis without current pathological fracture: Secondary | ICD-10-CM | POA: Insufficient documentation

## 2021-07-09 DIAGNOSIS — Z87891 Personal history of nicotine dependence: Secondary | ICD-10-CM | POA: Insufficient documentation

## 2021-07-09 DIAGNOSIS — Z96651 Presence of right artificial knee joint: Secondary | ICD-10-CM | POA: Diagnosis not present

## 2021-07-09 DIAGNOSIS — Z96659 Presence of unspecified artificial knee joint: Secondary | ICD-10-CM

## 2021-07-09 DIAGNOSIS — Z20822 Contact with and (suspected) exposure to covid-19: Secondary | ICD-10-CM | POA: Insufficient documentation

## 2021-07-09 DIAGNOSIS — Z8742 Personal history of other diseases of the female genital tract: Secondary | ICD-10-CM | POA: Insufficient documentation

## 2021-07-09 DIAGNOSIS — Z01818 Encounter for other preprocedural examination: Secondary | ICD-10-CM

## 2021-07-09 DIAGNOSIS — M1712 Unilateral primary osteoarthritis, left knee: Secondary | ICD-10-CM | POA: Diagnosis not present

## 2021-07-09 DIAGNOSIS — T84196A Other mechanical complication of internal fixation device of bone of right lower leg, initial encounter: Secondary | ICD-10-CM | POA: Diagnosis not present

## 2021-07-09 HISTORY — PX: KNEE ARTHROPLASTY: SHX992

## 2021-07-09 LAB — GLUCOSE, CAPILLARY: Glucose-Capillary: 268 mg/dL — ABNORMAL HIGH (ref 70–99)

## 2021-07-09 LAB — ABO/RH: ABO/RH(D): A POS

## 2021-07-09 SURGERY — ARTHROPLASTY, KNEE, TOTAL, USING IMAGELESS COMPUTER-ASSISTED NAVIGATION
Anesthesia: Spinal | Site: Knee | Laterality: Right

## 2021-07-09 MED ORDER — OXYCODONE HCL 5 MG PO TABS
5.0000 mg | ORAL_TABLET | ORAL | Status: DC | PRN
  Administered 2021-07-09: 5 mg via ORAL
  Filled 2021-07-09: qty 1

## 2021-07-09 MED ORDER — GLYCOPYRROLATE 0.2 MG/ML IJ SOLN
INTRAMUSCULAR | Status: AC
  Filled 2021-07-09: qty 1

## 2021-07-09 MED ORDER — CEFAZOLIN SODIUM-DEXTROSE 2-4 GM/100ML-% IV SOLN
2.0000 g | Freq: Four times a day (QID) | INTRAVENOUS | Status: AC
  Administered 2021-07-09 – 2021-07-10 (×2): 2 g via INTRAVENOUS
  Filled 2021-07-09 (×6): qty 100

## 2021-07-09 MED ORDER — PRONTOSAN WOUND IRRIGATION OPTIME
TOPICAL | Status: DC | PRN
  Administered 2021-07-09: 1 via TOPICAL

## 2021-07-09 MED ORDER — MIDAZOLAM HCL 5 MG/5ML IJ SOLN
INTRAMUSCULAR | Status: DC | PRN
  Administered 2021-07-09 (×2): 1 mg via INTRAVENOUS

## 2021-07-09 MED ORDER — ENOXAPARIN SODIUM 30 MG/0.3ML IJ SOSY
30.0000 mg | PREFILLED_SYRINGE | Freq: Two times a day (BID) | INTRAMUSCULAR | Status: DC
  Administered 2021-07-10: 30 mg via SUBCUTANEOUS
  Filled 2021-07-09 (×2): qty 0.3

## 2021-07-09 MED ORDER — INSULIN ASPART 100 UNIT/ML IJ SOLN
0.0000 [IU] | Freq: Three times a day (TID) | INTRAMUSCULAR | Status: DC
  Administered 2021-07-10: 3 [IU] via SUBCUTANEOUS
  Administered 2021-07-10: 2 [IU] via SUBCUTANEOUS
  Filled 2021-07-09 (×2): qty 1

## 2021-07-09 MED ORDER — GABAPENTIN 300 MG PO CAPS
ORAL_CAPSULE | ORAL | Status: AC
  Administered 2021-07-09: 300 mg via ORAL
  Filled 2021-07-09: qty 1

## 2021-07-09 MED ORDER — DIPHENHYDRAMINE HCL 12.5 MG/5ML PO ELIX
12.5000 mg | ORAL_SOLUTION | ORAL | Status: DC | PRN
  Filled 2021-07-09: qty 10

## 2021-07-09 MED ORDER — EZETIMIBE 10 MG PO TABS
10.0000 mg | ORAL_TABLET | Freq: Every day | ORAL | Status: DC
  Administered 2021-07-10: 10 mg via ORAL
  Filled 2021-07-09: qty 1

## 2021-07-09 MED ORDER — TRAMADOL HCL 50 MG PO TABS: 50.0000 mg | ORAL_TABLET | ORAL | Status: DC | PRN

## 2021-07-09 MED ORDER — HYDROCHLOROTHIAZIDE 25 MG PO TABS
25.0000 mg | ORAL_TABLET | Freq: Every day | ORAL | Status: DC
  Administered 2021-07-10: 25 mg via ORAL
  Filled 2021-07-09 (×2): qty 1

## 2021-07-09 MED ORDER — MAGNESIUM HYDROXIDE 400 MG/5ML PO SUSP
30.0000 mL | Freq: Every day | ORAL | Status: DC
  Administered 2021-07-09 – 2021-07-10 (×2): 30 mL via ORAL
  Filled 2021-07-09 (×2): qty 30

## 2021-07-09 MED ORDER — BUPIVACAINE HCL (PF) 0.5 % IJ SOLN
INTRAMUSCULAR | Status: DC | PRN
  Administered 2021-07-09: 2.8 mL via INTRATHECAL

## 2021-07-09 MED ORDER — SODIUM CHLORIDE (PF) 0.9 % IJ SOLN
INTRAMUSCULAR | Status: DC | PRN
  Administered 2021-07-09: 120 mL

## 2021-07-09 MED ORDER — HYDROMORPHONE HCL 1 MG/ML IJ SOLN: 0.5000 mg | INTRAMUSCULAR | Status: DC | PRN

## 2021-07-09 MED ORDER — PANTOPRAZOLE SODIUM 40 MG PO TBEC
40.0000 mg | DELAYED_RELEASE_TABLET | Freq: Two times a day (BID) | ORAL | Status: DC
  Administered 2021-07-09 – 2021-07-10 (×2): 40 mg via ORAL
  Filled 2021-07-09 (×3): qty 1

## 2021-07-09 MED ORDER — ONDANSETRON HCL 4 MG PO TABS
4.0000 mg | ORAL_TABLET | Freq: Four times a day (QID) | ORAL | Status: DC | PRN
  Filled 2021-07-09: qty 1

## 2021-07-09 MED ORDER — SODIUM CHLORIDE 0.9 % IV SOLN: INTRAVENOUS | Status: DC

## 2021-07-09 MED ORDER — DEXAMETHASONE SODIUM PHOSPHATE 10 MG/ML IJ SOLN
INTRAMUSCULAR | Status: AC
  Administered 2021-07-09: 8 mg via INTRAVENOUS
  Filled 2021-07-09: qty 1

## 2021-07-09 MED ORDER — OXYCODONE HCL 5 MG PO TABS: 10.0000 mg | ORAL_TABLET | ORAL | Status: DC | PRN

## 2021-07-09 MED ORDER — METOCLOPRAMIDE HCL 10 MG PO TABS
10.0000 mg | ORAL_TABLET | Freq: Three times a day (TID) | ORAL | Status: DC
  Administered 2021-07-09 – 2021-07-10 (×4): 10 mg via ORAL
  Filled 2021-07-09 (×4): qty 1

## 2021-07-09 MED ORDER — ACETAMINOPHEN 10 MG/ML IV SOLN
INTRAVENOUS | Status: DC | PRN
  Administered 2021-07-09: 1000 mg via INTRAVENOUS

## 2021-07-09 MED ORDER — CHLORHEXIDINE GLUCONATE 4 % EX LIQD: 60.0000 mL | Freq: Once | CUTANEOUS | Status: DC

## 2021-07-09 MED ORDER — LIDOCAINE HCL (PF) 2 % IJ SOLN
INTRAMUSCULAR | Status: DC | PRN
  Administered 2021-07-09: 50 mg

## 2021-07-09 MED ORDER — INSULIN ASPART 100 UNIT/ML IJ SOLN
0.0000 [IU] | Freq: Every day | INTRAMUSCULAR | Status: DC
  Administered 2021-07-09: 3 [IU] via SUBCUTANEOUS
  Filled 2021-07-09: qty 1

## 2021-07-09 MED ORDER — SODIUM CHLORIDE FLUSH 0.9 % IV SOLN
INTRAVENOUS | Status: AC
  Filled 2021-07-09: qty 40

## 2021-07-09 MED ORDER — GABAPENTIN 300 MG PO CAPS: 300.0000 mg | ORAL_CAPSULE | Freq: Once | ORAL | Status: AC

## 2021-07-09 MED ORDER — VITAMIN D3 25 MCG (1000 UNIT) PO TABS
5000.0000 [IU] | ORAL_TABLET | Freq: Every day | ORAL | Status: DC
  Administered 2021-07-10: 5000 [IU] via ORAL
  Filled 2021-07-09 (×2): qty 5

## 2021-07-09 MED ORDER — CELECOXIB 200 MG PO CAPS
ORAL_CAPSULE | ORAL | Status: AC
  Administered 2021-07-09: 400 mg via ORAL
  Filled 2021-07-09: qty 2

## 2021-07-09 MED ORDER — MENTHOL 3 MG MT LOZG
1.0000 | LOZENGE | OROMUCOSAL | Status: DC | PRN
  Filled 2021-07-09: qty 9

## 2021-07-09 MED ORDER — CHLORHEXIDINE GLUCONATE 0.12 % MT SOLN: 15.0000 mL | Freq: Once | OROMUCOSAL | Status: AC

## 2021-07-09 MED ORDER — TRANEXAMIC ACID-NACL 1000-0.7 MG/100ML-% IV SOLN
1000.0000 mg | INTRAVENOUS | Status: AC
  Administered 2021-07-09: 1000 mg via INTRAVENOUS

## 2021-07-09 MED ORDER — FAMOTIDINE 20 MG PO TABS
ORAL_TABLET | ORAL | Status: AC
  Filled 2021-07-09: qty 1

## 2021-07-09 MED ORDER — CELECOXIB 200 MG PO CAPS: 400.0000 mg | ORAL_CAPSULE | Freq: Once | ORAL | Status: AC

## 2021-07-09 MED ORDER — ALUM & MAG HYDROXIDE-SIMETH 200-200-20 MG/5ML PO SUSP: 30.0000 mL | ORAL | Status: DC | PRN

## 2021-07-09 MED ORDER — MIDAZOLAM HCL 2 MG/2ML IJ SOLN
INTRAMUSCULAR | Status: AC
  Filled 2021-07-09: qty 2

## 2021-07-09 MED ORDER — OYSTER SHELL CALCIUM/D3 500-5 MG-MCG PO TABS
1.0000 | ORAL_TABLET | Freq: Every day | ORAL | Status: DC
  Administered 2021-07-10: 1 via ORAL
  Filled 2021-07-09 (×2): qty 1

## 2021-07-09 MED ORDER — BISACODYL 10 MG RE SUPP
10.0000 mg | Freq: Every day | RECTAL | Status: DC | PRN
  Filled 2021-07-09: qty 1

## 2021-07-09 MED ORDER — PROPOFOL 1000 MG/100ML IV EMUL
INTRAVENOUS | Status: AC
  Filled 2021-07-09: qty 100

## 2021-07-09 MED ORDER — ORAL CARE MOUTH RINSE: 15.0000 mL | Freq: Once | OROMUCOSAL | Status: AC

## 2021-07-09 MED ORDER — ACETAMINOPHEN 10 MG/ML IV SOLN
1000.0000 mg | Freq: Four times a day (QID) | INTRAVENOUS | Status: AC
  Administered 2021-07-09 – 2021-07-10 (×3): 1000 mg via INTRAVENOUS
  Filled 2021-07-09 (×3): qty 100

## 2021-07-09 MED ORDER — ONDANSETRON HCL 4 MG/2ML IJ SOLN: 4.0000 mg | Freq: Once | INTRAMUSCULAR | Status: DC | PRN

## 2021-07-09 MED ORDER — DEXAMETHASONE SODIUM PHOSPHATE 10 MG/ML IJ SOLN: 8.0000 mg | Freq: Once | INTRAMUSCULAR | Status: AC

## 2021-07-09 MED ORDER — SENNOSIDES-DOCUSATE SODIUM 8.6-50 MG PO TABS
1.0000 | ORAL_TABLET | Freq: Two times a day (BID) | ORAL | Status: DC
  Administered 2021-07-09 – 2021-07-10 (×2): 1 via ORAL
  Filled 2021-07-09 (×3): qty 1

## 2021-07-09 MED ORDER — TRANEXAMIC ACID-NACL 1000-0.7 MG/100ML-% IV SOLN
INTRAVENOUS | Status: AC
  Filled 2021-07-09: qty 100

## 2021-07-09 MED ORDER — CEFAZOLIN SODIUM-DEXTROSE 2-4 GM/100ML-% IV SOLN
INTRAVENOUS | Status: AC
  Filled 2021-07-09: qty 100

## 2021-07-09 MED ORDER — PROPOFOL 500 MG/50ML IV EMUL
INTRAVENOUS | Status: DC | PRN
  Administered 2021-07-09: 25 ug/kg/min via INTRAVENOUS

## 2021-07-09 MED ORDER — ONDANSETRON HCL 4 MG/2ML IJ SOLN: 4.0000 mg | Freq: Four times a day (QID) | INTRAMUSCULAR | Status: DC | PRN

## 2021-07-09 MED ORDER — FERROUS SULFATE 325 (65 FE) MG PO TABS
325.0000 mg | ORAL_TABLET | Freq: Two times a day (BID) | ORAL | Status: DC
  Administered 2021-07-09 – 2021-07-10 (×3): 325 mg via ORAL
  Filled 2021-07-09 (×4): qty 1

## 2021-07-09 MED ORDER — CEFAZOLIN SODIUM-DEXTROSE 2-4 GM/100ML-% IV SOLN
2.0000 g | INTRAVENOUS | Status: AC
  Administered 2021-07-09: 2 g via INTRAVENOUS

## 2021-07-09 MED ORDER — 0.9 % SODIUM CHLORIDE (POUR BTL) OPTIME
TOPICAL | Status: DC | PRN
  Administered 2021-07-09: 200 mL

## 2021-07-09 MED ORDER — FENTANYL CITRATE (PF) 100 MCG/2ML IJ SOLN
INTRAMUSCULAR | Status: AC
  Filled 2021-07-09: qty 2

## 2021-07-09 MED ORDER — TRANEXAMIC ACID-NACL 1000-0.7 MG/100ML-% IV SOLN
1000.0000 mg | Freq: Once | INTRAVENOUS | Status: AC
Start: 2021-07-09 — End: 2021-07-09

## 2021-07-09 MED ORDER — TRANEXAMIC ACID-NACL 1000-0.7 MG/100ML-% IV SOLN
INTRAVENOUS | Status: AC
  Administered 2021-07-09: 1000 mg via INTRAVENOUS
  Filled 2021-07-09: qty 100

## 2021-07-09 MED ORDER — PHENOL 1.4 % MT LIQD
1.0000 | OROMUCOSAL | Status: DC | PRN
  Filled 2021-07-09: qty 177

## 2021-07-09 MED ORDER — CHLORHEXIDINE GLUCONATE 0.12 % MT SOLN
OROMUCOSAL | Status: AC
  Administered 2021-07-09: 15 mL via OROMUCOSAL
  Filled 2021-07-09: qty 15

## 2021-07-09 MED ORDER — GLYCOPYRROLATE 0.2 MG/ML IJ SOLN
INTRAMUSCULAR | Status: DC | PRN
  Administered 2021-07-09: .2 mg via INTRAVENOUS

## 2021-07-09 MED ORDER — LACTATED RINGERS IV SOLN: INTRAVENOUS | Status: DC

## 2021-07-09 MED ORDER — FLEET ENEMA 7-19 GM/118ML RE ENEM: 1.0000 | ENEMA | Freq: Once | RECTAL | Status: DC | PRN

## 2021-07-09 MED ORDER — ACETAMINOPHEN 325 MG PO TABS: 325.0000 mg | ORAL_TABLET | Freq: Four times a day (QID) | ORAL | Status: DC | PRN

## 2021-07-09 MED ORDER — BUPIVACAINE LIPOSOME 1.3 % IJ SUSP
INTRAMUSCULAR | Status: AC
  Filled 2021-07-09: qty 20

## 2021-07-09 MED ORDER — SODIUM CHLORIDE 0.9 % IR SOLN
Status: DC | PRN
  Administered 2021-07-09: 3000 mL

## 2021-07-09 MED ORDER — ACETAMINOPHEN 10 MG/ML IV SOLN
INTRAVENOUS | Status: AC
  Filled 2021-07-09: qty 100

## 2021-07-09 MED ORDER — FENTANYL CITRATE (PF) 100 MCG/2ML IJ SOLN: 25.0000 ug | INTRAMUSCULAR | Status: DC | PRN

## 2021-07-09 MED ORDER — BUPIVACAINE-EPINEPHRINE (PF) 0.25% -1:200000 IJ SOLN
INTRAMUSCULAR | Status: AC
  Filled 2021-07-09: qty 60

## 2021-07-09 MED ORDER — FENTANYL CITRATE (PF) 100 MCG/2ML IJ SOLN
INTRAMUSCULAR | Status: DC | PRN
  Administered 2021-07-09: 50 ug via INTRAVENOUS
  Administered 2021-07-09: 25 ug via INTRAVENOUS

## 2021-07-09 MED ORDER — LIDOCAINE HCL (PF) 2 % IJ SOLN
INTRAMUSCULAR | Status: AC
  Filled 2021-07-09: qty 5

## 2021-07-09 MED ORDER — CELECOXIB 200 MG PO CAPS
200.0000 mg | ORAL_CAPSULE | Freq: Two times a day (BID) | ORAL | Status: DC
  Administered 2021-07-09 – 2021-07-10 (×2): 200 mg via ORAL
  Filled 2021-07-09 (×3): qty 1

## 2021-07-09 SURGICAL SUPPLY — 76 items
ATTUNE MED DOME PAT 38 KNEE (Knees) ×1 IMPLANT
ATTUNE PS FEM RT SZ 5 CEM KNEE (Femur) ×1 IMPLANT
ATTUNE PSRP INSR SZ 5 10M KNEE (Insert) ×1 IMPLANT
BASE TIBIAL ROT PLAT SZ 5 KNEE (Knees) IMPLANT
BATTERY INSTRU NAVIGATION (MISCELLANEOUS) ×8 IMPLANT
BLADE SAW 70X12.5 (BLADE) ×2 IMPLANT
BLADE SAW 90X13X1.19 OSCILLAT (BLADE) ×2 IMPLANT
BLADE SAW 90X25X1.19 OSCILLAT (BLADE) ×2 IMPLANT
BONE CEMENT GENTAMICIN (Cement) ×4 IMPLANT
CEMENT BONE GENTAMICIN 40 (Cement) IMPLANT
COOLER POLAR GLACIER W/PUMP (MISCELLANEOUS) ×2 IMPLANT
CUFF TOURN SGL QUICK 24 (TOURNIQUET CUFF)
CUFF TOURN SGL QUICK 34 (TOURNIQUET CUFF)
CUFF TRNQT CYL 24X4X16.5-23 (TOURNIQUET CUFF) IMPLANT
CUFF TRNQT CYL 34X4.125X (TOURNIQUET CUFF) IMPLANT
DRAPE 3/4 80X56 (DRAPES) ×2 IMPLANT
DRAPE INCISE IOBAN 66X45 STRL (DRAPES) ×2 IMPLANT
DRSG DERMACEA 8X12 NADH (GAUZE/BANDAGES/DRESSINGS) ×2 IMPLANT
DRSG MEPILEX SACRM 8.7X9.8 (GAUZE/BANDAGES/DRESSINGS) ×2 IMPLANT
DRSG OPSITE POSTOP 4X14 (GAUZE/BANDAGES/DRESSINGS) ×2 IMPLANT
DRSG TEGADERM 4X4.75 (GAUZE/BANDAGES/DRESSINGS) ×2 IMPLANT
DURAPREP 26ML APPLICATOR (WOUND CARE) ×4 IMPLANT
ELECT CAUTERY BLADE 6.4 (BLADE) ×2 IMPLANT
ELECT REM PT RETURN 9FT ADLT (ELECTROSURGICAL) ×2
ELECTRODE REM PT RTRN 9FT ADLT (ELECTROSURGICAL) ×1 IMPLANT
EX-PIN ORTHOLOCK NAV 4X150 (PIN) ×4 IMPLANT
GLOVE SRG 8 PF TXTR STRL LF DI (GLOVE) ×1 IMPLANT
GLOVE SURG ENC TEXT LTX SZ7.5 (GLOVE) ×4 IMPLANT
GLOVE SURG UNDER POLY LF SZ7.5 (GLOVE) ×2 IMPLANT
GLOVE SURG UNDER POLY LF SZ8 (GLOVE) ×2
GOWN STRL REUS W/ TWL LRG LVL3 (GOWN DISPOSABLE) ×2 IMPLANT
GOWN STRL REUS W/ TWL XL LVL3 (GOWN DISPOSABLE) ×1 IMPLANT
GOWN STRL REUS W/TWL LRG LVL3 (GOWN DISPOSABLE) ×4
GOWN STRL REUS W/TWL XL LVL3 (GOWN DISPOSABLE) ×2
HEMOVAC 400CC 10FR (MISCELLANEOUS) ×2 IMPLANT
HOLDER FOLEY CATH W/STRAP (MISCELLANEOUS) ×2 IMPLANT
HOLSTER ELECTROSUGICAL PENCIL (MISCELLANEOUS) ×2 IMPLANT
IV NS IRRIG 3000ML ARTHROMATIC (IV SOLUTION) ×2 IMPLANT
KIT TURNOVER KIT A (KITS) ×2 IMPLANT
KNIFE SCULPS 14X20 (INSTRUMENTS) ×2 IMPLANT
LABEL OR SOLS (LABEL) ×2 IMPLANT
MANIFOLD NEPTUNE II (INSTRUMENTS) ×4 IMPLANT
NDL SAFETY ECLIPSE 18X1.5 (NEEDLE) ×1 IMPLANT
NDL SPNL 20GX3.5 QUINCKE YW (NEEDLE) ×2 IMPLANT
NEEDLE HYPO 18GX1.5 SHARP (NEEDLE) ×2
NEEDLE SPNL 20GX3.5 QUINCKE YW (NEEDLE) ×4 IMPLANT
NS IRRIG 500ML POUR BTL (IV SOLUTION) ×2 IMPLANT
PACK TOTAL KNEE (MISCELLANEOUS) ×2 IMPLANT
PAD ABD DERMACEA PRESS 5X9 (GAUZE/BANDAGES/DRESSINGS) ×4 IMPLANT
PAD WRAPON POLAR KNEE (MISCELLANEOUS) ×1 IMPLANT
PENCIL SMOKE EVACUATOR COATED (MISCELLANEOUS) ×2 IMPLANT
PIN DRILL FIX HALF THREAD (BIT) ×4 IMPLANT
PIN FIXATION 1/8DIA X 3INL (PIN) ×2 IMPLANT
PULSAVAC PLUS IRRIG FAN TIP (DISPOSABLE) ×2
SOL PREP PVP 2OZ (MISCELLANEOUS) ×2
SOLUTION PREP PVP 2OZ (MISCELLANEOUS) ×1 IMPLANT
SOLUTION PRONTOSAN WOUND 350ML (IRRIGATION / IRRIGATOR) ×2 IMPLANT
SPONGE DRAIN TRACH 4X4 STRL 2S (GAUZE/BANDAGES/DRESSINGS) ×2 IMPLANT
SPONGE T-LAP 18X18 ~~LOC~~+RFID (SPONGE) ×6 IMPLANT
STAPLER SKIN PROX 35W (STAPLE) ×2 IMPLANT
STOCKINETTE IMPERV 14X48 (MISCELLANEOUS) IMPLANT
STRAP TIBIA SHORT (MISCELLANEOUS) ×2 IMPLANT
SUCTION FRAZIER HANDLE 10FR (MISCELLANEOUS) ×2
SUCTION TUBE FRAZIER 10FR DISP (MISCELLANEOUS) ×1 IMPLANT
SUT VIC AB 0 CT1 36 (SUTURE) ×4 IMPLANT
SUT VIC AB 1 CT1 36 (SUTURE) ×4 IMPLANT
SUT VIC AB 2-0 CT2 27 (SUTURE) ×2 IMPLANT
SYR 20ML LL LF (SYRINGE) ×2 IMPLANT
SYR 30ML LL (SYRINGE) ×4 IMPLANT
TIBIAL BASE ROT PLAT SZ 5 KNEE (Knees) ×2 IMPLANT
TIP FAN IRRIG PULSAVAC PLUS (DISPOSABLE) ×1 IMPLANT
TOWEL OR 17X26 4PK STRL BLUE (TOWEL DISPOSABLE) ×2 IMPLANT
TOWER CARTRIDGE SMART MIX (DISPOSABLE) ×2 IMPLANT
TRAY FOLEY MTR SLVR 16FR STAT (SET/KITS/TRAYS/PACK) ×2 IMPLANT
WATER STERILE IRR 500ML POUR (IV SOLUTION) ×2 IMPLANT
WRAPON POLAR PAD KNEE (MISCELLANEOUS) ×2

## 2021-07-09 NOTE — H&P (Signed)
The patient has been re-examined, and the chart reviewed, and there have been no interval changes to the documented history and physical.    The risks, benefits, and alternatives have been discussed at length. The patient expressed understanding of the risks benefits and agreed with plans for surgical intervention.  Alania Overholt P. Rodolfo Gaster, Jr. M.D.    

## 2021-07-09 NOTE — Op Note (Signed)
OPERATIVE NOTE  DATE OF SURGERY:  07/09/2021  PATIENT NAME:  Claire Bishop   DOB: [birth year]  MRN: [id number]  PRE-OPERATIVE DIAGNOSIS: Degenerative arthrosis of the right knee, primary Retained hardware (2 cannulated cancellous screws and 2 washers) status post ORIF of tibial plateau fracture  POST-OPERATIVE DIAGNOSIS:  Same  PROCEDURE:  Right total knee arthroplasty using computer-assisted navigation Removal of retained screws from the proximal right tibia  SURGEON:  Marciano Sequin. M.D.  ASSISTANT: Cassell Smiles, PA-C (present and scrubbed throughout the case, critical for assistance with exposure, retraction, instrumentation, and closure)  ANESTHESIA: spinal  ESTIMATED BLOOD LOSS: 50 mL  FLUIDS REPLACED: 1300 mL of crystalloid  TOURNIQUET TIME: 110 minutes  DRAINS: 2 medium Hemovac drains  SOFT TISSUE RELEASES: Anterior cruciate ligament, posterior cruciate ligament, deep medial collateral ligament, patellofemoral ligament  IMPLANTS UTILIZED: DePuy Attune size 5 posterior stabilized femoral component (cemented), size 5 rotating platform tibial component (cemented), 38 mm medialized dome patella (cemented), and a 10 mm stabilized rotating platform polyethylene insert.  INDICATIONS FOR SURGERY: Claire Bishop is a 76 y.o. year old female with a long history of progressive knee pain. X-rays demonstrated severe degenerative changes in tricompartmental fashion. The patient had not seen any significant improvement despite conservative nonsurgical intervention. After discussion of the risks and benefits of surgical intervention, the patient expressed understanding of the risks benefits and agree with plans for total knee arthroplasty.   The risks, benefits, and alternatives were discussed at length including but not limited to the risks of infection, bleeding, nerve injury, stiffness, blood clots, the need for revision surgery, cardiopulmonary complications, among others, and they  were willing to proceed.  PROCEDURE IN DETAIL: The patient was brought into the operating room and, after adequate spinal anesthesia was achieved, a tourniquet was placed on the patient's upper thigh. The patient's knee and leg were cleaned and prepped with alcohol and DuraPrep and draped in the usual sterile fashion. A "timeout" was performed as per usual protocol. The lower extremity was exsanguinated using an Esmarch, and the tourniquet was inflated to 300 mmHg. An anterior longitudinal incision was made followed by a standard mid vastus approach. The deep fibers of the medial collateral ligament were elevated in a subperiosteal fashion off of the medial flare of the tibia so as to maintain a continuous soft tissue sleeve. The patella was subluxed laterally and the patellofemoral ligament was incised. Inspection of the knee demonstrated severe degenerative changes with full-thickness loss of articular cartilage. Osteophytes were debrided using a rongeur. Anterior and posterior cruciate ligaments were excised.  A lateral longitudinal incision was made over the palpable screw heads.  Dissection was carried down through the soft tissue and the two 4.0 mm cannulated partially-threaded cancellous screws with washers were removed without difficulty.  Two 4.0 mm Schanz pins were inserted in the femur and into the tibia for attachment of the array of trackers used for computer-assisted navigation. Hip center was identified using a circumduction technique. Distal landmarks were mapped using the computer. The distal femur and proximal tibia were mapped using the computer. The distal femoral cutting guide was positioned using computer-assisted navigation so as to achieve a 5 distal valgus cut. The femur was sized and it was felt that a size 5 femoral component was appropriate. A size 5 femoral cutting guide was positioned and the anterior cut was performed and verified using the computer. This was followed by completion  of the posterior and chamfer cuts. Femoral cutting guide for the  central box was then positioned in the center box cut was performed.  Attention was then directed to the proximal tibia. Medial and lateral menisci were excised. The extramedullary tibial cutting guide was positioned using computer-assisted navigation so as to achieve a 0 varus-valgus alignment and 3 posterior slope. The cut was performed and verified using the computer. The proximal tibia was sized and it was felt that a size 5 tibial tray was appropriate. Tibial and femoral trials were inserted followed by insertion of a 10 mm polyethylene insert. This allowed for excellent mediolateral soft tissue balancing both in flexion and in full extension. Finally, the patella was cut and prepared so as to accommodate a 38 mm medialized dome patella. A patella trial was placed and the knee was placed through a range of motion with excellent patellar tracking appreciated. The femoral trial was removed after debridement of posterior osteophytes. The central post-hole for the tibial component was reamed followed by insertion of a keel punch. Tibial trials were then removed. Cut surfaces of bone were irrigated with copious amounts of normal saline using pulsatile lavage and then suctioned dry. Polymethylmethacrylate cement with gentamicin was prepared in the usual fashion using a vacuum mixer. Cement was applied to the cut surface of the proximal tibia as well as along the undersurface of a size 5 rotating platform tibial component. Tibial component was positioned and impacted into place. Excess cement was removed using [employer] fast streamer. Cement was then applied to the cut surfaces of the femur as well as along the posterior flanges of the size 5 femoral component. The femoral component was positioned and impacted into place. Excess cement was removed using [employer] fast streamer. A 10 mm polyethylene trial was inserted and the knee was brought into full extension with  steady axial compression applied. Finally, cement was applied to the backside of a 38 mm medialized dome patella and the patellar component was positioned and patellar clamp applied. Excess cement was removed using [employer] fast streamer. After adequate curing of the cement, the tourniquet was deflated after a total tourniquet time of 110 minutes. Hemostasis was achieved using electrocautery. The knee was irrigated with copious amounts of normal saline using pulsatile lavage followed by 350 ml of Prontosan and then suctioned dry. 20 mL of 1.3% Exparel and 60 mL of 0.25% Marcaine in 40 mL of normal saline was injected along the posterior capsule, medial and lateral gutters, and along the arthrotomy site. A 10 mm stabilized rotating platform polyethylene insert was inserted and the knee was placed through a range of motion with excellent mediolateral soft tissue balancing appreciated and excellent patellar tracking noted. 2 medium drains were placed in the wound bed and brought out through separate stab incisions. The medial parapatellar portion of the incision was reapproximated using interrupted sutures of #1 Vicryl. Subcutaneous tissue was approximated in layers using first #0 Vicryl followed #2-0 Vicryl. The skin was approximated with skin staples. A sterile dressing was applied.  The patient tolerated the procedure well and was transported to the recovery room in stable condition.    Claire Bishop., M.D.

## 2021-07-09 NOTE — Anesthesia Preprocedure Evaluation (Signed)
Anesthesia Evaluation  Patient identified by MRN, date of birth, ID band Patient awake    Reviewed: Allergy & Precautions, H&P , NPO status , Patient's Chart, lab work & pertinent test results, reviewed documented beta blocker date and time   Airway Mallampati: II   Neck ROM: full    Dental  (+) Poor Dentition   Pulmonary neg pulmonary ROS, former smoker,    Pulmonary exam normal        Cardiovascular negative cardio ROS Normal cardiovascular exam Rhythm:regular Rate:Normal     Neuro/Psych negative neurological ROS  negative psych ROS   GI/Hepatic negative GI ROS, Neg liver ROS,   Endo/Other  negative endocrine ROS  Renal/GU negative Renal ROS  negative genitourinary   Musculoskeletal   Abdominal   Peds  Hematology negative hematology ROS (+)   Anesthesia Other Findings Past Medical History: No date: Abnormal cervical Papanicolaou smear No date: Carpal tunnel syndrome No date: Hyperlipidemia No date: Osteoarthritis No date: Osteoporosis No date: Pre-diabetes Past Surgical History: 1990: BREAST CYST ASPIRATION; Left     Comment:  neg 01/19/2020: BROW LIFT; Bilateral     Comment:  Procedure: BLEPHAROPLASTY UPPER EYELID; W/EXCESS SKIN               BILATERAL DIABETIC;  Surgeon: Karle Starch, MD;                Location: [facility];  Service: Ophthalmology;                Laterality: Bilateral;  Bilateral Eyelids 07/14/2017: COLONOSCOPY WITH PROPOFOL; N/A     Comment:  Procedure: COLONOSCOPY WITH PROPOFOL;  Surgeon: Manya Silvas, MD;  Location: [facility] ENDOSCOPY;  Service:               Endoscopy;  Laterality: N/A; 2009: Left hip fracture; Left 1999: Right knee fracture; Right   Reproductive/Obstetrics negative OB ROS                             Anesthesia Physical Anesthesia Plan  ASA: 3  Anesthesia Plan: Spinal   Post-op Pain Management:     Induction:   PONV Risk Score and Plan:   Airway Management Planned:   Additional Equipment:   Intra-op Plan:   Post-operative Plan:   Informed Consent: I have reviewed the patients History and Physical, chart, labs and discussed the procedure including the risks, benefits and alternatives for the proposed anesthesia with the patient or authorized representative who has indicated his/her understanding and acceptance.     Dental Advisory Given  Plan Discussed with: CRNA  Anesthesia Plan Comments:         Anesthesia Quick Evaluation

## 2021-07-09 NOTE — H&P (Signed)
ORTHOPAEDIC HISTORY & PHYSICAL Gwenlyn Fudge, [place] - 07/01/2021 8:45 AM EST Formatting of this note is different from the original. [place] [facility] Chief Complaint:   Chief Complaint  Patient presents with   Knee Pain  H & P RIGHT KNEE   History of Present Illness:   Claire Bishop is a 76 y.o. female that presents to clinic today for her preoperative history and evaluation. Patient presents unaccompanied. The patient is scheduled to undergo a right total knee arthroplasty with hardware removal from previous ORIF of right tibial plateau fracture on 07/09/2021 by Dr. Marry Guan. Patient initially underwent ORIF of right tibial plateau fracture in 2000. She has had multiple intra-articular cortisone injections by Dr. Jefm Bryant for treatment of right knee osteoarthritis. She has had an increase in her knee pain over the last several months. The pain is located primarily along the medial aspect of the knee. She describes her pain as worse with standing and walking. She reports associated swelling with some giving way of the knee. She denies associated numbness or tingling, denies locking.   The patient's symptoms have progressed to the point that they decrease her quality of life. The patient has previously undergone conservative treatment including NSAIDS and injections to the knee without adequate control of her symptoms.  Patient denies significant cardiac history, no history of DVTs, denies history of lumbar fusion. Does report history of tramadol--states it made her "feel very weird."  Patient lives at home with her husband who has a history of back issues. Her granddaughter is also available to help.   Of note, patient states that when her pains started she stopped taking all of her medications.  A1c 6.3 on 06/25/2021.  Past Medical, Surgical, Family, Social History, Allergies, Medications:   Past Medical History:  Past Medical History:   Diagnosis Date   Carpal tunnel syndrome   Diet-controlled type 2 diabetes mellitus (CMS-HCC) 06/23/2017   Hyperlipidemia   Osteoarthritis   Osteoporosis, post-menopausal   Past Surgical History:  Past Surgical History:  Procedure Laterality Date   COLONOSCOPY 02/20/2000  Adenomatous Polyp, FH Colon Polyps (Sister)   COLONOSCOPY 07/14/2017  PH Adenomatous Polyp, FH Colon Polyps (Sister): CBF 07/2022   REPAIR VAGINAL PROLAPSE W/WO SACROSPINOUS LIGAMENT SUSPENSION N/A 08/30/2017  Procedure: COLPOPEXY, VAGINAL; EXTRA-PERITONEAL APPROACH (SACROSPINOUS, ILIOCOCCYGEUS); Surgeon: Colletta Maryland, MD; Location: [facility] OR; Service: Gynecology; Laterality: N/A;   COLPORRHAPHY FOR REPAIR CYSTOCELE ANTERIOR N/A 08/30/2017  Procedure: ANTERIOR COLPORRHAPHY, REPAIR OF CYSTOCELE WITH OR WITHOUT REPAIR OF URETHROCELE, INCLUDING CYSTOURETHROSCOPY; Surgeon: Colletta Maryland, MD; Location: [facility] OR; Service: Gynecology; Laterality: N/A;   CYSTOURETHROSCOPY N/A 08/30/2017  Procedure: CYSTOURETHROSCOPY; Surgeon: Colletta Maryland, MD; Location: [facility] OR; Service: Gynecology; Laterality: N/A;   COLONOSCOPY 06/18/2012, 11/02/2003  PH Adenomatous Polyp, FH Colon Polyps (Sister): CBF 06/2017; Recall Ltr mailed 05/19/2017 (dw)   FRACTURE SURGERY Right  Right leg fracture   FRACTURE SURGERY Left  Left femur fracture   Right carpal tunnel   Current Medications:  Current Outpatient Medications  Medication Sig Dispense Refill   acetaminophen (TYLENOL) 500 MG tablet Take 2 tablets (1,000 mg total) by mouth every 4 (four) hours as needed for Pain   ibuprofen (MOTRIN) 200 MG tablet Take 4 tablets (800 mg total) by mouth every 4 (four) hours as needed for Pain   pantoprazole (PROTONIX) 40 MG DR tablet Take 40 mg by mouth once daily   No current facility-administered medications for this visit.   Allergies:  Allergies  Allergen Reactions   Ultram [Tramadol] Other (See Comments)  Felt "weird"   Social  History:  Social History   Socioeconomic History   Marital status: Married  Spouse name: Jeneen Rinks   Number of children: 3  Tobacco Use   Smoking status: Former  Packs/day: 0.50  Years: 25.00  Pack years: 12.50  Types: Cigarettes  Quit date: 07/07/1987  Years since quitting: 34.0   Smokeless tobacco: Never  Vaping Use   Vaping Use: Never used  Substance and Sexual Activity   Alcohol use: No  Comment: occas   Drug use: No   Sexual activity: Yes  Partners: Male  Birth control/protection: Post-menopausal   Family History:  Family History  Problem Relation Age of Onset   Cancer Brother  Oat cell carcinoma   Colon polyps Sister   Anesthesia problems Neg Hx   Malignant hyperthermia Neg Hx   Review of Systems:   A 10+ ROS was performed, reviewed, and the pertinent orthopaedic findings are documented in the HPI.   Physical Examination:   BP (!) 146/100 (BP Location: Left upper arm, Patient Position: Sitting, BP Cuff Size: Adult)   Ht 160 cm (5\' 3" )   Wt 74.3 kg (163 lb 12.8 oz)   BMI 29.02 kg/m   Patient is a well-developed, well-nourished female in no acute distress. Patient has normal mood and affect. Patient is alert and oriented to person, place, and time.   HEENT: Atraumatic, normocephalic. Pupils equal and reactive to light. Extraocular motion intact. Noninjected sclera.  Cardiovascular: Regular rate and rhythm, with no murmurs, rubs, or gallops. Distal pulses palpable.  Respiratory: Lungs clear to auscultation bilaterally.   Right Knee: Soft tissue swelling: mild Effusion: minimal Erythema: none Crepitance: minimal Tenderness: medial Alignment: relative varus Mediolateral laxity: medial pseudolaxity Posterior sag: negative Patellar tracking: Good tracking without evidence of subluxation or tilt Atrophy: No significant atrophy.  Quadriceps tone was fair to good. Range of motion: 0/0/133 degrees  Sensation intact over the saphenous, lateral sural cutaneous,  superficial fibular, and deep fibular nerve distributions.  Tests Performed/Reviewed:  X-rays  3 views of the right knee were reviewed. Images reveal moderate to severe loss of medial compartment joint space with osteophyte formation. No fractures or dislocations noted. 2 partially cannulated screws noted in the tibial metaphysis.  Impression:   ICD-10-CM  1. Primary osteoarthritis of right knee M17.11  2. S/P ORIF (open reduction internal fixation) fracture Z98.890  Z87.81   Plan:   The patient has end-stage degenerative changes of the right knee. It was explained to the patient that the condition is progressive in nature. Having failed conservative treatment, the patient has elected to proceed with a total joint arthroplasty. The patient will undergo a total joint arthroplasty with Dr. Marry Guan. The risks of surgery, including blood clot and infection, were discussed with the patient. Measures to reduce these risks, including the use of anticoagulation, perioperative antibiotics, and early ambulation were discussed. The importance of postoperative physical therapy was discussed with the patient. The patient elects to proceed with surgery. The patient is instructed to stop all blood thinners prior to surgery. The patient is instructed to call the hospital the day before surgery to learn of the proper arrival time. Also advised patient to contact her PCP about resuming blood pressure medicines as her blood pressure is high at today's visit.  Contact our office with any questions or concerns. Follow up as indicated, or sooner should any new problems arise, if conditions worsen, or if they are  otherwise concerned.   Gwenlyn Fudge, PA-C [facility] [place] [address] [place], [place] [address] Phone: [phone]  This note was generated in part with voice recognition software and I apologize for any typographical errors that were not detected and  corrected.  Electronically signed by Gwenlyn Fudge, PA at 07/01/2021 9:36 AM EST

## 2021-07-09 NOTE — Transfer of Care (Signed)
Immediate Anesthesia Transfer of Care Note  Patient: Claire Bishop  Procedure(s) Performed: COMPUTER ASSISTED TOTAL KNEE ARTHROPLASTY AND HARDWARE REMOVAL OF 2 SCREWS (Right: Knee)  Patient Location: PACU  Anesthesia Type:Spinal  Level of Consciousness: drowsy and patient cooperative  Airway & Oxygen Therapy: Patient Spontanous Breathing and Patient connected to face mask oxygen  Post-op Assessment: Report given to RN and Post -op Vital signs reviewed and stable  Post vital signs: Reviewed and stable  Last Vitals:  Vitals Value Taken Time  BP 107/71 07/09/21 1648  Temp 36.7 C 07/09/21 1648  Pulse 77 07/09/21 1651  Resp 18 07/09/21 1651  SpO2 90 % 07/09/21 1651  Vitals shown include unvalidated device data.  Last Pain:  Vitals:   07/09/21 1648  TempSrc:   PainSc: 0-No pain         Complications: No notable events documented.

## 2021-07-09 NOTE — Anesthesia Procedure Notes (Signed)
Spinal ° °Patient location during procedure: OR °Reason for block: surgical anesthesia °Staffing °Performed: resident/CRNA  °Anesthesiologist: Adams, James G, MD °Resident/CRNA: , , CRNA °Preanesthetic Checklist °Completed: patient identified, IV checked, site marked, risks and benefits discussed, surgical consent, monitors and equipment checked, pre-op evaluation and timeout performed °Spinal Block °Patient position: sitting °Prep: ChloraPrep and site prepped and draped °Patient monitoring: heart rate, continuous pulse ox, blood pressure and cardiac monitor °Approach: midline °Location: L4-5 °Injection technique: single-shot °Needle °Needle type: Whitacre and Introducer  °Needle gauge: 24 G °Needle length: 9 cm °Assessment °Events: CSF return °Additional Notes °Negative paresthesia. Negative blood return. Positive free-flowing CSF. Expiration date of kit checked and confirmed. Patient tolerated procedure well, without complications. ° ° ° ° ° °

## 2021-07-10 ENCOUNTER — Encounter: Payer: Self-pay | Admitting: Orthopedic Surgery

## 2021-07-10 DIAGNOSIS — M1711 Unilateral primary osteoarthritis, right knee: Secondary | ICD-10-CM | POA: Diagnosis not present

## 2021-07-10 DIAGNOSIS — Z96659 Presence of unspecified artificial knee joint: Secondary | ICD-10-CM | POA: Diagnosis not present

## 2021-07-10 LAB — GLUCOSE, CAPILLARY
Glucose-Capillary: 142 mg/dL — ABNORMAL HIGH (ref 70–99)
Glucose-Capillary: 160 mg/dL — ABNORMAL HIGH (ref 70–99)
Glucose-Capillary: 112 mg/dL — ABNORMAL HIGH (ref 70–99)

## 2021-07-10 MED ORDER — ENOXAPARIN SODIUM 40 MG/0.4ML IJ SOSY: 40.0000 mg | PREFILLED_SYRINGE | INTRAMUSCULAR | 0 refills | Status: DC

## 2021-07-10 MED ORDER — TRAMADOL HCL 50 MG PO TABS: 50.0000 mg | ORAL_TABLET | ORAL | 0 refills | Status: DC | PRN

## 2021-07-10 MED ORDER — CELECOXIB 200 MG PO CAPS: 200.0000 mg | ORAL_CAPSULE | Freq: Two times a day (BID) | ORAL | 0 refills | Status: DC

## 2021-07-10 MED ORDER — OXYCODONE HCL 5 MG PO TABS: 5.0000 mg | ORAL_TABLET | ORAL | 0 refills | Status: DC | PRN

## 2021-07-10 NOTE — Progress Notes (Signed)
Post-op dressing removed. , Hemovac removed., Mini compression dressing applied. , and Fresh honeycomb dressing applied.   

## 2021-07-10 NOTE — Progress Notes (Signed)
Physical Therapy Treatment Patient Details Name: Claire Bishop MRN: [id number] DOB: 1946/06/30 Today's Date: 07/10/2021   History of Present Illness Claire Bishop is a 22yoF who [facility] for elective Rt TKA c Dr. Marry Guan. Due to remote tibial plateau fracture, TKA procedure included removal of old hardware. Pt lives with husband at baseline, is a limited community AMB as needed, but has been more home bound recently as DJD of knee has progressed.    PT Comments    Pt seen for BID session this afternoon, asleep upon first attempt, still asleep upon return but awakens after entry. DTR Paige at bedside and with stairs training. Pt still performing bed mobility, transfers, and AMB without need for physical assist, able to advance distance by 60% this session, but not AMB to failure at present. Pt partakes in stairs training with single rail (will need a SPC or +1 assist to descend or alternative technique). HEP packet reviewed in full. Pain is well controlled.      Recommendations for follow up therapy are one component of a multi-disciplinary discharge planning process, led by the attending physician.  Recommendations may be updated based on patient status, additional functional criteria and insurance authorization.  Follow Up Recommendations  Follow physician's recommendations for discharge plan and follow up therapies     Assistance Recommended at Discharge Intermittent Supervision/Assistance  Patient can return home with the following Assist for transportation;Help with stairs or ramp for entrance   Equipment Recommendations  Rolling walker (2 wheels);BSC/3in1    Recommendations for Other Services       Precautions / Restrictions Precautions Precautions: Fall;Knee Precaution Booklet Issued: Yes (comment) Restrictions Weight Bearing Restrictions: Yes RLE Weight Bearing: Weight bearing as tolerated     Mobility  Bed Mobility Overal bed mobility: Modified  Independent             General bed mobility comments: NT, up in recliner    Transfers Overall transfer level: Needs assistance Equipment used: Rolling walker (2 wheels) Transfers: Sit to/from Stand Sit to Stand: Supervision           General transfer comment: EOB and BSC    Ambulation/Gait   Gait Distance (Feet): 160 Feet Assistive device: Rolling walker (2 wheels) Gait Pattern/deviations: WFL(Within Functional Limits);Step-through pattern Gait velocity: 0.75m/s     General Gait Details: naturally advances to a 2-point pattern after 50% distance   Stairs Stairs: Yes Stairs assistance: Min assist Stair Management: One rail Right;Forwards Number of Stairs: 4 General stair comments: MinA weight bearing on LUE into author or DTRs hand descending steps   Wheelchair Mobility    Modified Rankin (Stroke Patients Only)       Balance Overall balance assessment: No apparent balance deficits (not formally assessed)                                          Cognition Arousal/Alertness: Awake/alert Behavior During Therapy: WFL for tasks assessed/performed Overall Cognitive Status: Within Functional Limits for tasks assessed                                          Exercises Total Joint Exercises Ankle Circles/Pumps: AROM;Both;20 reps;Supine Quad Sets: AROM;Right;10 reps;Supine Short Arc Quad: AROM;Right;10 reps;Supine Heel Slides: AROM;Right;Supine;15 reps Hip ABduction/ADduction: AROM;Right;15 reps;Supine  Straight Leg Raises: AROM;Right;5 reps;Supine Long Arc Quad: AROM;Right;10 reps;Seated Knee Flexion: AROM;Right;Seated;10 reps Goniometric ROM: Rt knee flexion ROM: 15-70 degrees Other Exercises Other Exercises: Pt/dtr educated in home/routines modifications, falls prevention, pet care considerations, polar care mgt, compression stocking mgt, and AE/DME for ADL tasks. Handout provided to support recall and carryover.     General Comments        Pertinent Vitals/Pain Pain Assessment: 0-10 Pain Score: 0-No pain Pain Location: R knee Pain Descriptors / Indicators: Aching Pain Intervention(s): Limited activity within patient's tolerance;Monitored during session;Premedicated before session;Repositioned;Ice applied    Home Living Family/patient expects to be discharged to:: Private residence Living Arrangements: Spouse/significant other Available Help at Discharge: Family;Available 24 hours/day Type of Home: House Home Access: Stairs to enter Entrance Stairs-Rails: Right Entrance Stairs-Number of Steps: 4   Home Layout: One level Home Equipment: Conservation officer, nature (2 wheels) Additional Comments: RW just came out of attic, needs review    Prior Function            PT Goals (current goals can now be found in the care plan section) Acute Rehab PT Goals Patient Stated Goal: return to home PT Goal Formulation: With patient Time For Goal Achievement: 07/24/21 Potential to Achieve Goals: Good Progress towards PT goals: Progressing toward goals    Frequency    BID      PT Plan Current plan remains appropriate    Co-evaluation              AM-PAC PT "6 Clicks" Mobility   Outcome Measure  Help needed turning from your back to your side while in a flat bed without using bedrails?: A Little Help needed moving from lying on your back to sitting on the side of a flat bed without using bedrails?: A Little Help needed moving to and from a bed to a chair (including a wheelchair)?: A Little Help needed standing up from a chair using your arms (e.g., wheelchair or bedside chair)?: A Little Help needed to walk in hospital room?: A Little Help needed climbing 3-5 steps with a railing? : A Lot 6 Click Score: 17    End of Session Equipment Utilized During Treatment: Gait belt Activity Tolerance: Patient tolerated treatment well;No increased pain Patient left: with family/visitor present;with call  bell/phone within reach;in bed;with nursing/sitter in room   PT Visit Diagnosis: Other abnormalities of gait and mobility (R26.89);Difficulty in walking, not elsewhere classified (R26.2)     Time: 4098-1191 PT Time Calculation (min) (ACUTE ONLY): 27 min  Charges:  $Gait Training: 8-22 mins $Therapeutic Exercise: 8-22 mins                    3:16 PM, 07/10/21 Etta Grandchild, PT, DPT Physical Therapist - [facility]  [phone] ([place])    [place] C 07/10/2021, 3:14 PM

## 2021-07-10 NOTE — Progress Notes (Signed)
Patient was given verbal and written discharge instructions, she acknowledge understanding and states she will comply, taken to car by wheelchair, equipment went with patient home( bedside commode and walker).No distress noted when leaving the floor.

## 2021-07-10 NOTE — TOC Initial Note (Addendum)
Transition of Care [facility]) - Initial/Assessment Note    Patient Details  Name: Claire Bishop MRN: [id number] Date of Birth: February 21, 1946  Transition of Care [facility]) CM/SW Contact:    Candie Chroman, LCSW Phone Number: 07/10/2021, 3:37 PM  Clinical Narrative:  CSW met with patient. Family at bedside. CSW introduced role and explained that discharge planning would be discussed. Patient was set up with [facility] prior to admission and said they have already reached out to her. She is agreeable to RW and 3-in-1 if covered by insurance. Ordered through [employer]. Patient is currently under observation status. She does not want to stay another night if she is not switched to inpatient. PA is aware.             4:30 pm: [facility] representative is aware of potential discharge today. DME has been delivered to the room.      Expected Discharge Plan: [place] Barriers to Discharge: Continued Medical Work up   Patient Goals and CMS Choice        Expected Discharge Plan and Services Expected Discharge Plan: [place] Choice: Durable Medical Equipment, Home Health Living arrangements for the past 2 months: Single Family Home                 DME Arranged: 3-N-1, Walker rolling DME Agency: [facility] Date DME Agency Contacted: 07/10/21   Representative spoke with at DME Agency: [place]: PT [place]: [facility]     Representative spoke with at [place]: Set up prior to admission.  Prior Living Arrangements/Services Living arrangements for the past 2 months: Single Family Home Lives with:: Spouse Patient language and need for interpreter reviewed:: Yes Do you feel safe going back to the place where you live?: Yes      Need for Family Participation in Patient Care: Yes (Comment) Care giver support system in place?: Yes (comment)   Criminal Activity/Legal Involvement Pertinent to Current  Situation/Hospitalization: No - Comment as needed  Activities of Daily Living Home Assistive Devices/Equipment: Gilford Rile (specify type) ADL Screening (condition at time of admission) Patient's cognitive ability adequate to safely complete daily activities?: Yes Is the patient deaf or have difficulty hearing?: No Does the patient have difficulty seeing, even when wearing glasses/contacts?: No Does the patient have difficulty concentrating, remembering, or making decisions?: Yes (sometimes) Patient able to express need for assistance with ADLs?: Yes Does the patient have difficulty dressing or bathing?: No Independently performs ADLs?: Yes (appropriate for developmental age) Does the patient have difficulty walking or climbing stairs?: No Weakness of Legs: None Weakness of Arms/Hands: None  Permission Sought/Granted Permission sought to share information with : Facility Sport and exercise psychologist, Family Supports          Permission granted to share info w Relationship: Family at bedside     Emotional Assessment Appearance:: Appears stated age Attitude/Demeanor/Rapport: Engaged, Gracious Affect (typically observed): Accepting, Appropriate, Pleasant Orientation: : Oriented to Self, Oriented to Place, Oriented to  Time, Oriented to Situation Alcohol / Substance Use: Not Applicable Psych Involvement: No (comment)  Admission diagnosis:  Total knee replacement status [Z96.659] Patient Active Problem List   Diagnosis Date Noted   Hx of abnormal cervical Pap smear 07/09/2021   Osteoporosis, post-menopausal 07/09/2021   Total knee replacement status 07/09/2021   Primary osteoarthritis of right knee 08/05/2018   DM type 2 with diabetic mixed hyperlipidemia ([place]) 02/28/2018   Gastroesophageal reflux  disease without esophagitis 08/10/2017   B12 deficiency 06/23/2017   Hyperlipidemia, mixed 06/22/2016   Pelvic organ prolapse quantification stage 2 cystocele 07/31/2014   PCP:  Rusty Aus,  MD Pharmacy:   [id number] [id number]: [phone]: [phone]    Social Determinants of Health (SDOH) Interventions    Readmission Risk Interventions No flowsheet data found.

## 2021-07-10 NOTE — Care Management Obs Status (Signed)
[place] NOTIFICATION   Patient Details  Name: Claire Bishop MRN: [id number] Date of Birth: [birth year]   Medicare Observation Status Notification Given:  Yes    Candie Chroman, LCSW 07/10/2021, 4:30 PM

## 2021-07-10 NOTE — Evaluation (Signed)
Physical Therapy Evaluation Patient Details Name: Claire Bishop MRN: [id number] DOB: 1946-02-19 Today's Date: 07/10/2021  History of Present Illness  Claire Bishop is a 22yoF who comes to [facility] on 07/09/21 for elective Rt TKA c Dr. Marry Guan. Due to remote tibial plateau fracture, TKA procedure included removal of old hardware. Pt lives with husband at baseline, is a limited community AMB as needed, but has been more home bound recently as DJD of knee has progressed.  Clinical Impression  Pt admitted with above diagnosis. Pt currently with functional limitations due to the deficits listed below (see "PT Problem List"). Upon entry, pt in bed, awake and agreeable to participate. The pt is alert, pleasant, interactive, and able to provide info regarding prior level of function, both in tolerance and independence. Pt has no pain at rest, no pain at EOB, in walking reports " I can feel my knee now." No physical assist needed for mobility, gait speed far above average for POD1 TKA. Started HEP education, but expedited OOB due to bladder urgency. Patient's performance this date reveals decreased ability, independence, and tolerance in performing all basic mobility required for performance of activities of daily living. Pt requires additional DME, close physical assistance, and cues for safe participate in mobility. Pt will benefit from skilled PT intervention to increase independence and safety with basic mobility in preparation for discharge to the venue listed below.      Recommendations for follow up therapy are one component of a multi-disciplinary discharge planning process, led by the attending physician.  Recommendations may be updated based on patient status, additional functional criteria and insurance authorization.  Follow Up Recommendations Follow physician's recommendations for discharge plan and follow up therapies    Assistance Recommended at Discharge Intermittent Supervision/Assistance  Patient  can return home with the following  Assist for transportation;Help with stairs or ramp for entrance    Equipment Recommendations Rolling walker (2 wheels);BSC/3in1  Recommendations for Other Services       Functional Status Assessment Patient has had a recent decline in their functional status and demonstrates the ability to make significant improvements in function in a reasonable and predictable amount of time.     Precautions / Restrictions Precautions Precautions: Fall;Knee Precaution Booklet Issued: Yes (comment) Restrictions Weight Bearing Restrictions: Yes RLE Weight Bearing: Weight bearing as tolerated      Mobility  Bed Mobility Overal bed mobility: Modified Independent                  Transfers Overall transfer level: Needs assistance Equipment used: Rolling walker (2 wheels) Transfers: Sit to/from Stand Sit to Stand: Min guard           General transfer comment: minimal pain or weakness limitations, but pt uses operative leg in a limited capacity    Ambulation/Gait   Gait Distance (Feet): 100 Feet Assistive device: Rolling walker (2 wheels) Gait Pattern/deviations: WFL(Within Functional Limits);Step-to pattern Gait velocity: 0.79m/s     General Gait Details: naturally advances to a 2-point pattern  Stairs            Wheelchair Mobility    Modified Rankin (Stroke Patients Only)       Balance Overall balance assessment: Modified Independent                                           Pertinent Vitals/Pain Pain Assessment: 0-10 Pain Score:  0-No pain Pain Intervention(s): Limited activity within patient's tolerance;Premedicated before session;Monitored during session    [place] expects to be discharged to:: Private residence Living Arrangements: Spouse/significant other Available Help at Discharge: Family;Available 24 hours/day Type of Home: House Home Access: Stairs to enter Entrance  Stairs-Rails: Right Entrance Stairs-Number of Steps: 4   Home Layout: One level Home Equipment: Conservation officer, nature (2 wheels) Additional Comments: RW just came out of attic, needs review    Prior Function Prior Level of Function : Independent/Modified Independent;Driving             Mobility Comments: mostly homebound, but short community trips       Hand Dominance   Dominant Hand: Right    Extremity/Trunk Assessment                Communication      Cognition Arousal/Alertness: Awake/alert Behavior During Therapy: WFL for tasks assessed/performed Overall Cognitive Status: Within Functional Limits for tasks assessed                                          General Comments      Exercises Total Joint Exercises Ankle Circles/Pumps: AROM;Both;20 reps;Supine Quad Sets: AROM;Right;10 reps;Supine Long Arc Quad: AROM;Right;10 reps;Seated Knee Flexion: AROM;Right;Seated;10 reps Goniometric ROM: Rt knee flexion ROM: 15-70 degrees   Assessment/Plan    PT Assessment Patient needs continued PT services  PT Problem List Decreased strength;Decreased range of motion;Decreased activity tolerance;Decreased mobility;Decreased knowledge of use of DME;Decreased safety awareness;Decreased knowledge of precautions       PT Treatment Interventions DME instruction;Balance training;Gait training;Stair training;Functional mobility training;Therapeutic activities;Therapeutic exercise;Patient/family education    PT Goals (Current goals can be found in the Care Plan section)  Acute Rehab PT Goals Patient Stated Goal: return to home PT Goal Formulation: With patient Time For Goal Achievement: 07/24/21 Potential to Achieve Goals: Good    Frequency BID     Co-evaluation               AM-PAC PT "6 Clicks" Mobility  Outcome Measure Help needed turning from your back to your side while in a flat bed without using bedrails?: A Little Help needed moving from  lying on your back to sitting on the side of a flat bed without using bedrails?: A Little Help needed moving to and from a bed to a chair (including a wheelchair)?: A Little Help needed standing up from a chair using your arms (e.g., wheelchair or bedside chair)?: A Little Help needed to walk in hospital room?: A Little Help needed climbing 3-5 steps with a railing? : A Lot 6 Click Score: 17    End of Session Equipment Utilized During Treatment: Gait belt Activity Tolerance: Patient tolerated treatment well;No increased pain Patient left: in chair;with family/visitor present;with call bell/phone within reach   PT Visit Diagnosis: Other abnormalities of gait and mobility (R26.89);Difficulty in walking, not elsewhere classified (R26.2)    Time: 1000-1042 PT Time Calculation (min) (ACUTE ONLY): 42 min   Charges:   PT Evaluation $PT Eval Moderate Complexity: 1 Mod PT Treatments $Gait Training: 8-22 mins $Therapeutic Exercise: 8-22 mins       12:58 PM, 07/10/21 Etta Grandchild, PT, DPT Physical Therapist - [facility]  [phone] ([place])   Revillo C 07/10/2021, 12:58 PM

## 2021-07-10 NOTE — Anesthesia Postprocedure Evaluation (Signed)
Anesthesia Post Note  Patient: EMMERSYN KRATZKE  Procedure(s) Performed: COMPUTER ASSISTED TOTAL KNEE ARTHROPLASTY AND HARDWARE REMOVAL OF 2 SCREWS (Right: Knee)  Patient location during evaluation: Nursing Unit Anesthesia Type: Spinal Level of consciousness: oriented and awake and alert Pain management: pain level controlled Vital Signs Assessment: post-procedure vital signs reviewed and stable Respiratory status: spontaneous breathing and respiratory function stable Cardiovascular status: blood pressure returned to baseline and stable Postop Assessment: no headache, no backache, no apparent nausea or vomiting and patient able to bend at knees Anesthetic complications: no   No notable events documented.   Last Vitals:  Vitals:   07/10/21 0537 07/10/21 0734  BP: (!) 136/98 (!) 159/86  Pulse: 77 76  Resp: 16 16  Temp: 36.6 C 36.7 C  SpO2: 95% 96%    Last Pain:  Vitals:   07/09/21 1952  TempSrc:   PainSc: 2                  Gerda Yin

## 2021-07-10 NOTE — Discharge Summary (Signed)
Physician Discharge Summary  Patient ID: Claire Bishop MRN: [id number] DOB/AGE: 1945-12-14 76 y.o.  Admit date: 07/09/2021 Discharge date: 07/10/2021  Admission Diagnoses:  Total knee replacement status [Z96.659]  Surgeries:Procedure(s): Right total knee arthroplasty using computer-assisted navigation Removal of retained screws from the proximal right tibia   SURGEON:  Marciano Sequin. M.D.   ASSISTANT: Cassell Smiles, PA-C (present and scrubbed throughout the case, critical for assistance with exposure, retraction, instrumentation, and closure)   ANESTHESIA: spinal   ESTIMATED BLOOD LOSS: 50 mL   FLUIDS REPLACED: 1300 mL of crystalloid   TOURNIQUET TIME: 110 minutes   DRAINS: 2 medium Hemovac drains   SOFT TISSUE RELEASES: Anterior cruciate ligament, posterior cruciate ligament, deep medial collateral ligament, patellofemoral ligament   IMPLANTS UTILIZED: DePuy Attune size 5 posterior stabilized femoral component (cemented), size 5 rotating platform tibial component (cemented), 38 mm medialized dome patella (cemented), and a 10 mm stabilized rotating platform polyethylene insert.  Discharge Diagnoses: Patient Active Problem List   Diagnosis Date Noted   Hx of abnormal cervical Pap smear 07/09/2021   Osteoporosis, post-menopausal 07/09/2021   Total knee replacement status 07/09/2021   Primary osteoarthritis of right knee 08/05/2018   DM type 2 with diabetic mixed hyperlipidemia ([place]) 02/28/2018   Gastroesophageal reflux disease without esophagitis 08/10/2017   B12 deficiency 06/23/2017   Hyperlipidemia, mixed 06/22/2016   Pelvic organ prolapse quantification stage 2 cystocele 07/31/2014    Past Medical History:  Diagnosis Date   Abnormal cervical Papanicolaou smear    Carpal tunnel syndrome    Hyperlipidemia    Osteoarthritis    Osteoporosis    Pre-diabetes      Transfusion:    Consultants (if any):   Discharged Condition: Improved  Hospital Course:  Claire Bishop is an 76 y.o. female who was admitted 07/09/2021 with a diagnosis of right knee osteoarthritis and went to the operating room on 07/09/2021 and underwent right total knee arthroplasty with removal of retained hardware (2 cannulated cancellous screws and washers). The patient received perioperative antibiotics for prophylaxis (see below). The patient tolerated the procedure well and was transported to PACU in stable condition. After meeting PACU criteria, the patient was subsequently transferred to the Orthopaedics/Rehabilitation unit.   The patient received DVT prophylaxis in the form of early mobilization, Lovenox, Foot Pumps, and TED hose. A sacral pad had been placed and heels were elevated off of the bed with rolled towels in order to protect skin integrity. Foley catheter was discontinued on postoperative day #0. Wound drains were discontinued on postoperative day #1. The surgical incision was healing well without signs of infection.  Physical therapy was initiated postoperatively for transfers, gait training, and strengthening. Occupational therapy was initiated for activities of daily living and evaluation for assisted devices. Rehabilitation goals were reviewed in detail with the patient. The patient made steady progress with physical therapy and physical therapy recommended discharge to Home.   The patient achieved the preliminary goals of this hospitalization and was felt to be medically and orthopaedically appropriate for discharge.  She was given perioperative antibiotics:  Anti-infectives (From admission, onward)    Start     Dose/Rate Route Frequency Ordered Stop   07/09/21 1900  ceFAZolin (ANCEF) IVPB 2g/100 mL premix        2 g 200 mL/hr over 30 Minutes Intravenous Every 6 hours 07/09/21 1748 07/10/21 0134   07/09/21 1203  ceFAZolin (ANCEF) 2-4 GM/100ML-% IVPB       Note to Pharmacy: Lyman Bishop T:  cabinet override      07/09/21 1203 07/09/21 1308   07/09/21 1200   ceFAZolin (ANCEF) IVPB 2g/100 mL premix        2 g 200 mL/hr over 30 Minutes Intravenous On call to O.R. 07/09/21 1147 07/09/21 1332     .  Recent vital signs:  Vitals:   07/10/21 1212 07/10/21 1527  BP: 140/80 (!) 141/80  Pulse: 86 77  Resp: 16 16  Temp: 97.7 F (36.5 C) 98.4 F (36.9 C)  SpO2: 97% 97%    Recent laboratory studies:  No results for input(s): WBC, HGB, HCT, PLT, K, CL, CO2, BUN, CREATININE, GLUCOSE, CALCIUM, LABPT, INR in the last 72 hours.  Diagnostic Studies: DG Knee Right Port  Result Date: 07/09/2021 CLINICAL DATA:  Knee replacement. EXAM: PORTABLE RIGHT KNEE - 1-2 VIEW COMPARISON:  MRI right knee dated Nov 12, 2015. FINDINGS: The right knee demonstrates a total knee arthroplasty without evidence of hardware failure or complication. There is expected intra-articular air. There is no fracture or dislocation. The alignment is anatomic. Surgical drains are present. Post-surgical changes noted in the surrounding soft tissues. IMPRESSION: 1. Right total knee arthroplasty without acute postoperative complication. Electronically Signed   By: Titus Dubin M.D.   On: 07/09/2021 17:09    Discharge Medications:   Allergies as of 07/10/2021       Reactions   Ultram [tramadol]    "made me feel real funny"        Medication List     STOP taking these medications    acetaminophen-codeine 300-30 MG tablet Commonly known as: TYLENOL #3   aspirin EC 81 MG tablet   ibuprofen 200 MG tablet Commonly known as: ADVIL       TAKE these medications    acetaminophen 500 MG tablet Commonly known as: TYLENOL Take 1,000 mg by mouth every 6 (six) hours as needed for moderate pain.   calcium-vitamin D 500-200 MG-UNIT tablet Commonly known as: OSCAL WITH D Take 1 tablet by mouth.   celecoxib 200 MG capsule Commonly known as: CELEBREX Take 1 capsule (200 mg total) by mouth 2 (two) times daily.   Cholecalciferol 125 MCG (5000 UT) Tabs Take by mouth.   Co Q-10 200  MG Caps Take 200 mg by mouth daily.   enoxaparin 40 MG/0.4ML injection Commonly known as: LOVENOX Inject 0.4 mLs (40 mg total) into the skin daily for 14 days.   erythromycin ophthalmic ointment Apply to sutures 4 times a day for 10-12 days.  Discontinue if allergy develops and call our office   ezetimibe 10 MG tablet Commonly known as: ZETIA Take 10 mg by mouth daily.   hydrochlorothiazide 25 MG tablet Commonly known as: HYDRODIURIL Take 25 mg by mouth daily.   ICAPS AREDS 2 PO Take by mouth.   oxyCODONE 5 MG immediate release tablet Commonly known as: Oxy IR/ROXICODONE Take 1 tablet (5 mg total) by mouth every 4 (four) hours as needed for severe pain.   pantoprazole 40 MG tablet Commonly known as: PROTONIX Take 40 mg by mouth daily as needed (acid reflux).   traMADol 50 MG tablet Commonly known as: ULTRAM Take 1 tablet (50 mg total) by mouth every 4 (four) hours as needed for moderate pain.               Durable Medical Equipment  (From admission, onward)           Start     Ordered   07/09/21 1748  DME Walker rolling  Once       Question:  Patient needs a walker to treat with the following condition  Answer:  Total knee replacement status   07/09/21 1747   07/09/21 1748  DME Bedside commode  Once       Question:  Patient needs a bedside commode to treat with the following condition  Answer:  Total knee replacement status   07/09/21 1747            Disposition: Home with home health PT     Follow-up Information     Fausto Skillern, PA-C Follow up on 07/24/2021.   Specialty: Orthopedic Surgery Why: at 1:15pm Contact information: [place] [address] [phone]         Dereck Leep, MD Follow up on 08/21/2021.   Specialty: Orthopedic Surgery Why: at 2:30pm Contact information: [place]  [address] [address], [place] Follow up.   Specialty: Home Health Services Why: They will follow up with you for your home health physical therapy needs. Contact information: [address] [place] [address] [phone]                  Tamala Julian, PA-C 07/10/2021, 5:28 PM

## 2021-07-10 NOTE — Progress Notes (Signed)
°  Subjective: 1 Day Post-Op Procedure(s) (LRB): COMPUTER ASSISTED TOTAL KNEE ARTHROPLASTY AND HARDWARE REMOVAL OF 2 SCREWS (Right) Patient reports pain as well-controlled.   Patient is well, and has had no acute complaints or problems Plan is to go Home after hospital stay. Negative for chest pain and shortness of breath Fever: no Gastrointestinal: negative for nausea and vomiting.  Patient has not had a bowel movement.  Objective: Vital signs in last 24 hours: Temp:  [97.4 F (36.3 C)-98.1 F (36.7 C)] 98 F (36.7 C) (01/05 0734) Pulse Rate:  [66-85] 76 (01/05 0734) Resp:  [13-22] 16 (01/05 0734) BP: (107-159)/(69-98) 159/86 (01/05 0734) SpO2:  [95 %-98 %] 96 % (01/05 0734) Weight:  [73.5 kg] 73.5 kg (01/04 1841)  Intake/Output from previous day:  Intake/Output Summary (Last 24 hours) at 07/10/2021 0748 Last data filed at 07/10/2021 0601 Gross per 24 hour  Intake 1700 ml  Output 1260 ml  Net 440 ml    Intake/Output this shift: No intake/output data recorded.  Labs: No results for input(s): HGB in the last 72 hours. No results for input(s): WBC, RBC, HCT, PLT in the last 72 hours. No results for input(s): NA, K, CL, CO2, BUN, CREATININE, GLUCOSE, CALCIUM in the last 72 hours. No results for input(s): LABPT, INR in the last 72 hours.   EXAM General - Patient is Alert, Appropriate, and Oriented Extremity - Neurovascular intact Dorsiflexion/Plantar flexion intact Compartment soft Dressing/Incision -Postoperative dressing remains in place., Polar Care in place and working. , Hemovac in place.  Motor Function - intact, moving foot and toes well on exam. Able to perform independent SLR.  Cardiovascular- Regular rate and rhythm, no murmurs/rubs/gallops Respiratory- Lungs clear to auscultation bilaterally Gastrointestinal- soft, nontender, and active bowel sounds   Assessment/Plan: 1 Day Post-Op Procedure(s) (LRB): COMPUTER ASSISTED TOTAL KNEE ARTHROPLASTY AND HARDWARE  REMOVAL OF 2 SCREWS (Right) Principal Problem:   Total knee replacement status  Estimated body mass index is 28.7 kg/m as calculated from the following:   Height as of this encounter: 5\' 3"  (1.6 m).   Weight as of this encounter: 73.5 kg. Advance diet Up with therapy      DVT Prophylaxis - Lovenox, Ted hose, and foot pumps Weight-Bearing as tolerated to right leg  Cassell Smiles, PA-C [facility] Orthopaedic Surgery 07/10/2021, 7:48 AM

## 2021-07-10 NOTE — Evaluation (Signed)
Occupational Therapy Evaluation Patient Details Name: Claire Bishop MRN: [id number] DOB: 01-Mar-1946 Today's Date: 07/10/2021   History of Present Illness Claire Bishop is a 84yoF who comes to [facility] on 07/09/21 for elective Rt TKA c Dr. Marry Guan. Due to remote tibial plateau fracture, TKA procedure included removal of old hardware. Pt lives with husband at baseline, is a limited community AMB as needed, but has been more home bound recently as DJD of knee has progressed.   Clinical Impression   Pt seen for OT evaluation this date, POD#1 from above surgery. Pt was independent in all ADL prior to surgery and is eager to return to PLOF with less pain and improved safety and independence. Pt currently requires MIN A for LB ADL tasks and MOD A for polar care and compression stocking mgt 2/2 decr strength/ROM/pain in R knee, CGA for ADL transfers using RW. Pt reports family able to provide needed level of assis. Pt/dtr educated in home/routines modifications, falls prevention, pet care considerations, polar care mgt, compression stocking mgt, and AE/DME for ADL tasks. Handout provided to support recall and carryover. Pt/family verbalized understanding. Pt would benefit from skilled OT services while hospitalized including additional instruction in dressing techniques with or without assistive devices for dressing and bathing skills to support recall and carryover prior to discharge and ultimately to maximize safety, independence, and minimize falls risk and caregiver burden. Therapy at discharge per surgeon.      Recommendations for follow up therapy are one component of a multi-disciplinary discharge planning process, led by the attending physician.  Recommendations may be updated based on patient status, additional functional criteria and insurance authorization.   Follow Up Recommendations  Follow physician's recommendations for discharge plan and follow up therapies    Assistance Recommended at Discharge  Intermittent Supervision/Assistance  Patient can return home with the following A little help with walking and/or transfers;A little help with bathing/dressing/bathroom;Assistance with cooking/housework;Assist for transportation;Help with stairs or ramp for entrance    Functional Status Assessment  Patient has had a recent decline in their functional status and demonstrates the ability to make significant improvements in function in a reasonable and predictable amount of time.  Equipment Recommendations  BSC/3in1;Other (comment) (reacher, 2WW)    Recommendations for Other Services       Precautions / Restrictions Precautions Precautions: Fall;Knee Precaution Booklet Issued: Yes (comment) Restrictions Weight Bearing Restrictions: Yes RLE Weight Bearing: Weight bearing as tolerated      Mobility Bed Mobility              General bed mobility comments: NT, up in recliner    Transfers Overall transfer level: Needs assistance Equipment used: Rolling walker (2 wheels) Transfers: Sit to/from Stand Sit to Stand: Min guard           General transfer comment: minimal pain or weakness limitations, but pt uses operative leg in a limited capacity      Balance Overall balance assessment: No apparent balance deficits (not formally assessed)                                         ADL either performed or assessed with clinical judgement   ADL  General ADL Comments: Pt currently requires MIN A for LB ADL tasks and MOD A for polar care and compression stocking mgt 2/2 decr strength/ROM/pain in R knee, CGA for ADL transfers using RW; pt reports family able to provide needed level of assist     Vision         Perception     Praxis      Pertinent Vitals/Pain Pain Assessment: 0-10 Pain Score: 2  Pain Location: R knee Pain Descriptors / Indicators: Aching Pain Intervention(s): Limited activity  within patient's tolerance;Monitored during session;Premedicated before session;Repositioned;Ice applied     Hand Dominance Right   Extremity/Trunk Assessment Upper Extremity Assessment Upper Extremity Assessment: Overall WFL for tasks assessed   Lower Extremity Assessment Lower Extremity Assessment: RLE deficits/detail RLE Deficits / Details: expected post-op strength/ROM deficits       Communication     Cognition Arousal/Alertness: Awake/alert Behavior During Therapy: WFL for tasks assessed/performed Overall Cognitive Status: Within Functional Limits for tasks assessed                                       General Comments       Exercises Other Exercises: Pt/dtr educated in home/routines modifications, falls prevention, pet care considerations, polar care mgt, compression stocking mgt, and AE/DME for ADL tasks. Handout provided to support recall and carryover.   Shoulder Instructions      Home Living Family/patient expects to be discharged to:: Private residence Living Arrangements: Spouse/significant other Available Help at Discharge: Family;Available 24 hours/day Type of Home: House Home Access: Stairs to enter [employer] of Steps: 4 Entrance Stairs-Rails: Right Home Layout: One level         Bathroom Toilet: Handicapped height (has both)     Home Equipment: Conservation officer, nature (2 wheels)   Additional Comments: RW just came out of attic, needs review      Prior Functioning/Environment Prior Level of Function : Independent/Modified Independent;Driving             Mobility Comments: mostly homebound, but short community trips ADLs Comments: mod indep with ADL, IADL        OT Problem List: Decreased strength;Pain;Decreased range of motion;Decreased activity tolerance;Decreased knowledge of use of DME or AE      OT Treatment/Interventions: Self-care/ADL training;Therapeutic exercise;Therapeutic activities;DME and/or AE  instruction;Patient/family education    OT Goals(Current goals can be found in the care plan section) Acute Rehab OT Goals Patient Stated Goal: go home and get better OT Goal Formulation: With patient/family Time For Goal Achievement: 07/24/21 Potential to Achieve Goals: Good ADL Goals Pt Will Perform Lower Body Dressing: with modified independence;sit to/from stand;with caregiver independent in assisting Pt Will Transfer to Toilet: with modified independence;ambulating (BSC over toilet, LRAD PRN) Additional ADL Goal #1: Pt will independently instruct family in polar care mgt Additional ADL Goal #2: Pt will independently instruct family in compression stocking mgt  OT Frequency: Min 2X/week    Co-evaluation              AM-PAC OT "6 Clicks" Daily Activity     Outcome Measure Help from another person eating meals?: None Help from another person taking care of personal grooming?: None Help from another person toileting, which includes using toliet, bedpan, or urinal?: A Little Help from another person bathing (including washing, rinsing, drying)?: A Little Help from another person to put on and taking off regular upper body  clothing?: None Help from another person to put on and taking off regular lower body clothing?: A Little 6 Click Score: 21   End of Session    Activity Tolerance: Patient tolerated treatment well Patient left: in chair;with call bell/phone within reach;with chair alarm set;with family/visitor present;Other (comment) (polar care in place)  OT Visit Diagnosis: Other abnormalities of gait and mobility (R26.89);Muscle weakness (generalized) (M62.81);Pain Pain - Right/Left: Right Pain - part of body: Knee                Time: 0973-5329 OT Time Calculation (min): 30 min Charges:  OT General Charges $OT Visit: 1 Visit OT Evaluation $OT Eval Moderate Complexity: 1 Mod OT Treatments $Self Care/Home Management : 8-22 mins  Ardeth Perfect., MPH, MS, OTR/L ascom  [phone] 07/10/21, 2:01 PM

## 2021-07-12 DIAGNOSIS — Z96651 Presence of right artificial knee joint: Secondary | ICD-10-CM | POA: Diagnosis not present

## 2021-07-12 DIAGNOSIS — E538 Deficiency of other specified B group vitamins: Secondary | ICD-10-CM | POA: Diagnosis not present

## 2021-07-12 DIAGNOSIS — Z471 Aftercare following joint replacement surgery: Secondary | ICD-10-CM | POA: Diagnosis not present

## 2021-07-12 DIAGNOSIS — K219 Gastro-esophageal reflux disease without esophagitis: Secondary | ICD-10-CM | POA: Diagnosis not present

## 2021-07-12 DIAGNOSIS — M810 Age-related osteoporosis without current pathological fracture: Secondary | ICD-10-CM | POA: Diagnosis not present

## 2021-07-12 DIAGNOSIS — G5600 Carpal tunnel syndrome, unspecified upper limb: Secondary | ICD-10-CM | POA: Diagnosis not present

## 2021-07-12 DIAGNOSIS — Z87891 Personal history of nicotine dependence: Secondary | ICD-10-CM | POA: Diagnosis not present

## 2021-07-12 DIAGNOSIS — K5900 Constipation, unspecified: Secondary | ICD-10-CM | POA: Diagnosis not present

## 2021-07-12 DIAGNOSIS — E1169 Type 2 diabetes mellitus with other specified complication: Secondary | ICD-10-CM | POA: Diagnosis not present

## 2021-07-12 DIAGNOSIS — E782 Mixed hyperlipidemia: Secondary | ICD-10-CM | POA: Diagnosis not present

## 2021-07-12 DIAGNOSIS — I10 Essential (primary) hypertension: Secondary | ICD-10-CM | POA: Diagnosis not present

## 2021-07-24 DIAGNOSIS — M25661 Stiffness of right knee, not elsewhere classified: Secondary | ICD-10-CM | POA: Diagnosis not present

## 2021-07-24 DIAGNOSIS — M25561 Pain in right knee: Secondary | ICD-10-CM | POA: Diagnosis not present

## 2021-07-24 DIAGNOSIS — Z96651 Presence of right artificial knee joint: Secondary | ICD-10-CM | POA: Diagnosis not present

## 2021-07-24 DIAGNOSIS — M6281 Muscle weakness (generalized): Secondary | ICD-10-CM | POA: Diagnosis not present

## 2021-07-28 DIAGNOSIS — M25661 Stiffness of right knee, not elsewhere classified: Secondary | ICD-10-CM | POA: Diagnosis not present

## 2021-07-28 DIAGNOSIS — M6281 Muscle weakness (generalized): Secondary | ICD-10-CM | POA: Diagnosis not present

## 2021-07-28 DIAGNOSIS — Z96651 Presence of right artificial knee joint: Secondary | ICD-10-CM | POA: Diagnosis not present

## 2021-07-28 DIAGNOSIS — M25561 Pain in right knee: Secondary | ICD-10-CM | POA: Diagnosis not present

## 2021-07-29 DIAGNOSIS — Z471 Aftercare following joint replacement surgery: Secondary | ICD-10-CM | POA: Diagnosis not present

## 2021-07-31 DIAGNOSIS — E782 Mixed hyperlipidemia: Secondary | ICD-10-CM | POA: Diagnosis not present

## 2021-07-31 DIAGNOSIS — E538 Deficiency of other specified B group vitamins: Secondary | ICD-10-CM | POA: Diagnosis not present

## 2021-07-31 DIAGNOSIS — E1169 Type 2 diabetes mellitus with other specified complication: Secondary | ICD-10-CM | POA: Diagnosis not present

## 2021-08-01 DIAGNOSIS — Z96651 Presence of right artificial knee joint: Secondary | ICD-10-CM | POA: Diagnosis not present

## 2021-08-01 DIAGNOSIS — M6281 Muscle weakness (generalized): Secondary | ICD-10-CM | POA: Diagnosis not present

## 2021-08-01 DIAGNOSIS — M25661 Stiffness of right knee, not elsewhere classified: Secondary | ICD-10-CM | POA: Diagnosis not present

## 2021-08-01 DIAGNOSIS — M25561 Pain in right knee: Secondary | ICD-10-CM | POA: Diagnosis not present

## 2021-08-05 DIAGNOSIS — Z96651 Presence of right artificial knee joint: Secondary | ICD-10-CM | POA: Diagnosis not present

## 2021-08-05 DIAGNOSIS — M25561 Pain in right knee: Secondary | ICD-10-CM | POA: Diagnosis not present

## 2021-08-05 DIAGNOSIS — M25661 Stiffness of right knee, not elsewhere classified: Secondary | ICD-10-CM | POA: Diagnosis not present

## 2021-08-05 DIAGNOSIS — M6281 Muscle weakness (generalized): Secondary | ICD-10-CM | POA: Diagnosis not present

## 2021-08-07 DIAGNOSIS — E538 Deficiency of other specified B group vitamins: Secondary | ICD-10-CM | POA: Diagnosis not present

## 2021-08-07 DIAGNOSIS — M25561 Pain in right knee: Secondary | ICD-10-CM | POA: Diagnosis not present

## 2021-08-07 DIAGNOSIS — M25661 Stiffness of right knee, not elsewhere classified: Secondary | ICD-10-CM | POA: Diagnosis not present

## 2021-08-07 DIAGNOSIS — E1169 Type 2 diabetes mellitus with other specified complication: Secondary | ICD-10-CM | POA: Diagnosis not present

## 2021-08-07 DIAGNOSIS — Z0000 Encounter for general adult medical examination without abnormal findings: Secondary | ICD-10-CM | POA: Diagnosis not present

## 2021-08-07 DIAGNOSIS — M6281 Muscle weakness (generalized): Secondary | ICD-10-CM | POA: Diagnosis not present

## 2021-08-07 DIAGNOSIS — Z96651 Presence of right artificial knee joint: Secondary | ICD-10-CM | POA: Diagnosis not present

## 2021-08-07 DIAGNOSIS — E782 Mixed hyperlipidemia: Secondary | ICD-10-CM | POA: Diagnosis not present

## 2021-08-12 DIAGNOSIS — M6281 Muscle weakness (generalized): Secondary | ICD-10-CM | POA: Diagnosis not present

## 2021-08-12 DIAGNOSIS — Z96651 Presence of right artificial knee joint: Secondary | ICD-10-CM | POA: Diagnosis not present

## 2021-08-12 DIAGNOSIS — M25561 Pain in right knee: Secondary | ICD-10-CM | POA: Diagnosis not present

## 2021-08-12 DIAGNOSIS — M25661 Stiffness of right knee, not elsewhere classified: Secondary | ICD-10-CM | POA: Diagnosis not present

## 2021-08-14 DIAGNOSIS — M6281 Muscle weakness (generalized): Secondary | ICD-10-CM | POA: Diagnosis not present

## 2021-08-14 DIAGNOSIS — M25561 Pain in right knee: Secondary | ICD-10-CM | POA: Diagnosis not present

## 2021-08-14 DIAGNOSIS — Z96651 Presence of right artificial knee joint: Secondary | ICD-10-CM | POA: Diagnosis not present

## 2021-08-14 DIAGNOSIS — M25661 Stiffness of right knee, not elsewhere classified: Secondary | ICD-10-CM | POA: Diagnosis not present

## 2021-08-19 DIAGNOSIS — M25561 Pain in right knee: Secondary | ICD-10-CM | POA: Diagnosis not present

## 2021-08-19 DIAGNOSIS — Z96651 Presence of right artificial knee joint: Secondary | ICD-10-CM | POA: Diagnosis not present

## 2021-08-19 DIAGNOSIS — M25661 Stiffness of right knee, not elsewhere classified: Secondary | ICD-10-CM | POA: Diagnosis not present

## 2021-08-19 DIAGNOSIS — M6281 Muscle weakness (generalized): Secondary | ICD-10-CM | POA: Diagnosis not present

## 2021-08-21 DIAGNOSIS — M25561 Pain in right knee: Secondary | ICD-10-CM | POA: Diagnosis not present

## 2021-08-21 DIAGNOSIS — Z96651 Presence of right artificial knee joint: Secondary | ICD-10-CM | POA: Diagnosis not present

## 2021-08-21 DIAGNOSIS — M25661 Stiffness of right knee, not elsewhere classified: Secondary | ICD-10-CM | POA: Diagnosis not present

## 2021-08-21 DIAGNOSIS — M6281 Muscle weakness (generalized): Secondary | ICD-10-CM | POA: Diagnosis not present

## 2021-08-28 DIAGNOSIS — E119 Type 2 diabetes mellitus without complications: Secondary | ICD-10-CM | POA: Diagnosis not present

## 2021-10-01 ENCOUNTER — Other Ambulatory Visit: Payer: Self-pay | Admitting: Internal Medicine

## 2021-10-01 DIAGNOSIS — Z1231 Encounter for screening mammogram for malignant neoplasm of breast: Secondary | ICD-10-CM

## 2021-11-06 ENCOUNTER — Ambulatory Visit
Admission: RE | Admit: 2021-11-06 | Discharge: 2021-11-06 | Disposition: A | Discharge status: Home / Self Care | Payer: PPO | Source: Ambulatory Visit | Attending: Internal Medicine | Admitting: Internal Medicine

## 2021-11-06 DIAGNOSIS — Z1231 Encounter for screening mammogram for malignant neoplasm of breast: Secondary | ICD-10-CM | POA: Insufficient documentation

## 2022-01-08 DIAGNOSIS — Z96651 Presence of right artificial knee joint: Secondary | ICD-10-CM | POA: Diagnosis not present

## 2022-01-08 DIAGNOSIS — E782 Mixed hyperlipidemia: Secondary | ICD-10-CM | POA: Diagnosis not present

## 2022-01-08 DIAGNOSIS — E1169 Type 2 diabetes mellitus with other specified complication: Secondary | ICD-10-CM | POA: Diagnosis not present

## 2022-01-08 DIAGNOSIS — M76899 Other specified enthesopathies of unspecified lower limb, excluding foot: Secondary | ICD-10-CM | POA: Diagnosis not present

## 2022-01-28 DIAGNOSIS — E1169 Type 2 diabetes mellitus with other specified complication: Secondary | ICD-10-CM | POA: Diagnosis not present

## 2022-01-28 DIAGNOSIS — E538 Deficiency of other specified B group vitamins: Secondary | ICD-10-CM | POA: Diagnosis not present

## 2022-01-28 DIAGNOSIS — E782 Mixed hyperlipidemia: Secondary | ICD-10-CM | POA: Diagnosis not present

## 2022-02-04 DIAGNOSIS — E538 Deficiency of other specified B group vitamins: Secondary | ICD-10-CM | POA: Diagnosis not present

## 2022-02-04 DIAGNOSIS — E782 Mixed hyperlipidemia: Secondary | ICD-10-CM | POA: Diagnosis not present

## 2022-02-04 DIAGNOSIS — E1169 Type 2 diabetes mellitus with other specified complication: Secondary | ICD-10-CM | POA: Diagnosis not present

## 2022-02-10 DIAGNOSIS — H0012 Chalazion right lower eyelid: Secondary | ICD-10-CM | POA: Diagnosis not present

## 2022-06-25 DIAGNOSIS — L738 Other specified follicular disorders: Secondary | ICD-10-CM | POA: Diagnosis not present

## 2022-06-25 DIAGNOSIS — L813 Cafe au lait spots: Secondary | ICD-10-CM | POA: Diagnosis not present

## 2022-06-25 DIAGNOSIS — L720 Epidermal cyst: Secondary | ICD-10-CM | POA: Diagnosis not present

## 2022-06-25 DIAGNOSIS — L570 Actinic keratosis: Secondary | ICD-10-CM | POA: Diagnosis not present

## 2022-06-25 DIAGNOSIS — L814 Other melanin hyperpigmentation: Secondary | ICD-10-CM | POA: Diagnosis not present

## 2022-06-25 DIAGNOSIS — D2271 Melanocytic nevi of right lower limb, including hip: Secondary | ICD-10-CM | POA: Diagnosis not present

## 2022-07-16 DIAGNOSIS — E782 Mixed hyperlipidemia: Secondary | ICD-10-CM | POA: Diagnosis not present

## 2022-07-16 DIAGNOSIS — Z96651 Presence of right artificial knee joint: Secondary | ICD-10-CM | POA: Diagnosis not present

## 2022-07-16 DIAGNOSIS — E1169 Type 2 diabetes mellitus with other specified complication: Secondary | ICD-10-CM | POA: Diagnosis not present

## 2022-07-21 DIAGNOSIS — R053 Chronic cough: Secondary | ICD-10-CM | POA: Diagnosis not present

## 2022-07-21 DIAGNOSIS — I10 Essential (primary) hypertension: Secondary | ICD-10-CM | POA: Diagnosis not present

## 2022-07-21 DIAGNOSIS — Z8616 Personal history of COVID-19: Secondary | ICD-10-CM | POA: Diagnosis not present

## 2022-07-21 DIAGNOSIS — R059 Cough, unspecified: Secondary | ICD-10-CM | POA: Diagnosis not present

## 2022-08-04 DIAGNOSIS — Z0000 Encounter for general adult medical examination without abnormal findings: Secondary | ICD-10-CM | POA: Diagnosis not present

## 2022-08-04 DIAGNOSIS — E538 Deficiency of other specified B group vitamins: Secondary | ICD-10-CM | POA: Diagnosis not present

## 2022-08-04 DIAGNOSIS — R053 Chronic cough: Secondary | ICD-10-CM | POA: Diagnosis not present

## 2022-08-04 DIAGNOSIS — E1169 Type 2 diabetes mellitus with other specified complication: Secondary | ICD-10-CM | POA: Diagnosis not present

## 2022-08-04 DIAGNOSIS — E782 Mixed hyperlipidemia: Secondary | ICD-10-CM | POA: Diagnosis not present

## 2022-08-31 DIAGNOSIS — E119 Type 2 diabetes mellitus without complications: Secondary | ICD-10-CM | POA: Diagnosis not present

## 2022-08-31 DIAGNOSIS — H353132 Nonexudative age-related macular degeneration, bilateral, intermediate dry stage: Secondary | ICD-10-CM | POA: Diagnosis not present

## 2022-08-31 DIAGNOSIS — H2513 Age-related nuclear cataract, bilateral: Secondary | ICD-10-CM | POA: Diagnosis not present

## 2022-08-31 DIAGNOSIS — H43813 Vitreous degeneration, bilateral: Secondary | ICD-10-CM | POA: Diagnosis not present

## 2022-09-30 DIAGNOSIS — R42 Dizziness and giddiness: Secondary | ICD-10-CM | POA: Diagnosis not present

## 2022-09-30 DIAGNOSIS — H903 Sensorineural hearing loss, bilateral: Secondary | ICD-10-CM | POA: Diagnosis not present

## 2023-01-25 DIAGNOSIS — E1169 Type 2 diabetes mellitus with other specified complication: Secondary | ICD-10-CM | POA: Diagnosis not present

## 2023-01-25 DIAGNOSIS — E782 Mixed hyperlipidemia: Secondary | ICD-10-CM | POA: Diagnosis not present

## 2023-02-01 DIAGNOSIS — E538 Deficiency of other specified B group vitamins: Secondary | ICD-10-CM | POA: Diagnosis not present

## 2023-02-01 DIAGNOSIS — E1169 Type 2 diabetes mellitus with other specified complication: Secondary | ICD-10-CM | POA: Diagnosis not present

## 2023-02-01 DIAGNOSIS — M4726 Other spondylosis with radiculopathy, lumbar region: Secondary | ICD-10-CM | POA: Diagnosis not present

## 2023-02-01 DIAGNOSIS — M5117 Intervertebral disc disorders with radiculopathy, lumbosacral region: Secondary | ICD-10-CM | POA: Diagnosis not present

## 2023-02-01 DIAGNOSIS — M5116 Intervertebral disc disorders with radiculopathy, lumbar region: Secondary | ICD-10-CM | POA: Diagnosis not present

## 2023-02-01 DIAGNOSIS — M7912 Myalgia of auxiliary muscles, head and neck: Secondary | ICD-10-CM | POA: Diagnosis not present

## 2023-02-01 DIAGNOSIS — E782 Mixed hyperlipidemia: Secondary | ICD-10-CM | POA: Diagnosis not present

## 2023-02-01 DIAGNOSIS — M4727 Other spondylosis with radiculopathy, lumbosacral region: Secondary | ICD-10-CM | POA: Diagnosis not present

## 2023-02-10 ENCOUNTER — Ambulatory Visit
Admission: RE | Admit: 2023-02-10 | Discharge: 2023-02-10 | Disposition: A | Discharge status: Home / Self Care | Payer: PPO | Source: Ambulatory Visit | Attending: Internal Medicine | Admitting: Internal Medicine

## 2023-02-10 ENCOUNTER — Other Ambulatory Visit: Payer: Self-pay | Admitting: Internal Medicine

## 2023-02-10 DIAGNOSIS — Z1231 Encounter for screening mammogram for malignant neoplasm of breast: Secondary | ICD-10-CM

## 2023-03-11 DIAGNOSIS — E119 Type 2 diabetes mellitus without complications: Secondary | ICD-10-CM | POA: Diagnosis not present

## 2023-03-11 DIAGNOSIS — H43813 Vitreous degeneration, bilateral: Secondary | ICD-10-CM | POA: Diagnosis not present

## 2023-03-11 DIAGNOSIS — Z961 Presence of intraocular lens: Secondary | ICD-10-CM | POA: Diagnosis not present

## 2023-03-11 DIAGNOSIS — H353132 Nonexudative age-related macular degeneration, bilateral, intermediate dry stage: Secondary | ICD-10-CM | POA: Diagnosis not present

## 2023-03-11 DIAGNOSIS — Z0100 Encounter for examination of eyes and vision without abnormal findings: Secondary | ICD-10-CM | POA: Diagnosis not present

## 2023-03-11 DIAGNOSIS — H2513 Age-related nuclear cataract, bilateral: Secondary | ICD-10-CM | POA: Diagnosis not present

## 2023-03-24 DIAGNOSIS — H2511 Age-related nuclear cataract, right eye: Secondary | ICD-10-CM | POA: Diagnosis not present

## 2023-03-30 DIAGNOSIS — G3184 Mild cognitive impairment, so stated: Secondary | ICD-10-CM | POA: Diagnosis not present

## 2023-03-30 DIAGNOSIS — E1169 Type 2 diabetes mellitus with other specified complication: Secondary | ICD-10-CM | POA: Diagnosis not present

## 2023-03-30 DIAGNOSIS — R399 Unspecified symptoms and signs involving the genitourinary system: Secondary | ICD-10-CM | POA: Diagnosis not present

## 2023-03-30 DIAGNOSIS — L237 Allergic contact dermatitis due to plants, except food: Secondary | ICD-10-CM | POA: Diagnosis not present

## 2023-03-30 DIAGNOSIS — E782 Mixed hyperlipidemia: Secondary | ICD-10-CM | POA: Diagnosis not present

## 2023-03-30 NOTE — Anesthesia Preprocedure Evaluation (Addendum)
Anesthesia Evaluation  Patient identified by MRN, date of birth, ID band Patient awake    Reviewed: Allergy & Precautions, H&P , NPO status , Patient's Chart, lab work & pertinent test results  Airway Mallampati: III  TM Distance: <3 FB Neck ROM: Full    Dental no notable dental hx. (+) Caps   Pulmonary former smoker   Pulmonary exam normal breath sounds clear to auscultation       Cardiovascular hypertension, Normal cardiovascular exam Rhythm:Regular Rate:Normal     Neuro/Psych  Neuromuscular disease  negative psych ROS   GI/Hepatic Neg liver ROS,GERD  ,,  Endo/Other  diabetes    Renal/GU negative Renal ROS  negative genitourinary   Musculoskeletal  (+) Arthritis ,    Abdominal   Peds negative pediatric ROS (+)  Hematology negative hematology ROS (+)   Anesthesia Other Findings HTN Osteoporosis Osteoarthritis Hyperlipidemia Diabetes, on ozempic   Note ozempic  Reproductive/Obstetrics negative OB ROS                              Anesthesia Physical Anesthesia Plan  ASA: 2  Anesthesia Plan: MAC   Post-op Pain Management:    Induction: Intravenous  PONV Risk Score and Plan:   Airway Management Planned: Natural Airway and Nasal Cannula  Additional Equipment:   Intra-op Plan:   Post-operative Plan:   Informed Consent: I have reviewed the patients History and Physical, chart, labs and discussed the procedure including the risks, benefits and alternatives for the proposed anesthesia with the patient or authorized representative who has indicated his/her understanding and acceptance.     Dental Advisory Given  Plan Discussed with: Anesthesiologist, CRNA and Surgeon  Anesthesia Plan Comments: (Patient consented for risks of anesthesia including but not limited to:  - adverse reactions to medications - damage to eyes, teeth, lips or other oral mucosa - nerve damage due  to positioning  - sore throat or hoarseness - Damage to heart, brain, nerves, lungs, other parts of body or loss of life  Patient voiced understanding.)       Anesthesia Quick Evaluation

## 2023-03-31 ENCOUNTER — Encounter: Payer: Self-pay | Admitting: Ophthalmology

## 2023-04-06 NOTE — Discharge Instructions (Signed)

## 2023-04-07 ENCOUNTER — Ambulatory Visit: Payer: PPO | Admitting: Anesthesiology

## 2023-04-07 ENCOUNTER — Other Ambulatory Visit: Payer: Self-pay

## 2023-04-07 ENCOUNTER — Encounter: Payer: Self-pay | Admitting: Ophthalmology

## 2023-04-07 ENCOUNTER — Encounter
Admission: RE | Disposition: A | Discharge status: Home / Self Care | Payer: Self-pay | Source: Home / Self Care | Attending: Ophthalmology

## 2023-04-07 ENCOUNTER — Ambulatory Visit
Admission: RE | Admit: 2023-04-07 | Discharge: 2023-04-07 | Disposition: A | Discharge status: Home / Self Care | Payer: PPO | Attending: Ophthalmology | Admitting: Ophthalmology

## 2023-04-07 DIAGNOSIS — H2511 Age-related nuclear cataract, right eye: Secondary | ICD-10-CM | POA: Insufficient documentation

## 2023-04-07 DIAGNOSIS — I10 Essential (primary) hypertension: Secondary | ICD-10-CM | POA: Insufficient documentation

## 2023-04-07 DIAGNOSIS — Z87891 Personal history of nicotine dependence: Secondary | ICD-10-CM | POA: Diagnosis not present

## 2023-04-07 DIAGNOSIS — M1990 Unspecified osteoarthritis, unspecified site: Secondary | ICD-10-CM | POA: Insufficient documentation

## 2023-04-07 DIAGNOSIS — G709 Myoneural disorder, unspecified: Secondary | ICD-10-CM | POA: Diagnosis not present

## 2023-04-07 DIAGNOSIS — K219 Gastro-esophageal reflux disease without esophagitis: Secondary | ICD-10-CM | POA: Insufficient documentation

## 2023-04-07 DIAGNOSIS — Z7985 Long-term (current) use of injectable non-insulin antidiabetic drugs: Secondary | ICD-10-CM | POA: Diagnosis not present

## 2023-04-07 DIAGNOSIS — E1136 Type 2 diabetes mellitus with diabetic cataract: Secondary | ICD-10-CM | POA: Insufficient documentation

## 2023-04-07 HISTORY — DX: Essential (primary) hypertension: I10

## 2023-04-07 HISTORY — DX: Gastro-esophageal reflux disease without esophagitis: K21.9

## 2023-04-07 HISTORY — PX: CATARACT EXTRACTION W/PHACO: SHX586

## 2023-04-07 LAB — GLUCOSE, CAPILLARY: Glucose-Capillary: 105 mg/dL — ABNORMAL HIGH (ref 70–99)

## 2023-04-07 SURGERY — PHACOEMULSIFICATION, CATARACT, WITH IOL INSERTION
Anesthesia: Monitor Anesthesia Care | Site: Eye | Laterality: Right

## 2023-04-07 MED ORDER — SIGHTPATH DOSE#1 BSS IO SOLN
INTRAOCULAR | Status: DC | PRN
  Administered 2023-04-07: 1 mL

## 2023-04-07 MED ORDER — ARMC OPHTHALMIC DILATING DROPS
OPHTHALMIC | Status: AC
  Filled 2023-04-07: qty 0.5

## 2023-04-07 MED ORDER — BRIMONIDINE TARTRATE-TIMOLOL 0.2-0.5 % OP SOLN
OPHTHALMIC | Status: DC | PRN
  Administered 2023-04-07: 1 [drp] via OPHTHALMIC

## 2023-04-07 MED ORDER — TETRACAINE HCL 0.5 % OP SOLN
OPHTHALMIC | Status: AC
  Filled 2023-04-07: qty 4

## 2023-04-07 MED ORDER — TETRACAINE HCL 0.5 % OP SOLN
1.0000 [drp] | OPHTHALMIC | Status: DC | PRN
  Administered 2023-04-07 (×3): 1 [drp] via OPHTHALMIC

## 2023-04-07 MED ORDER — SODIUM CHLORIDE 0.9% FLUSH
INTRAVENOUS | Status: DC | PRN
  Administered 2023-04-07: 10 mL via INTRAVENOUS

## 2023-04-07 MED ORDER — ARMC OPHTHALMIC DILATING DROPS
1.0000 | OPHTHALMIC | Status: DC | PRN
  Administered 2023-04-07 (×3): 1 via OPHTHALMIC

## 2023-04-07 MED ORDER — SIGHTPATH DOSE#1 BSS IO SOLN
INTRAOCULAR | Status: DC | PRN
  Administered 2023-04-07: 15 mL

## 2023-04-07 MED ORDER — SIGHTPATH DOSE#1 NA HYALUR & NA CHOND-NA HYALUR IO KIT
PACK | INTRAOCULAR | Status: DC | PRN
  Administered 2023-04-07: 1 via OPHTHALMIC

## 2023-04-07 MED ORDER — FENTANYL CITRATE (PF) 100 MCG/2ML IJ SOLN
INTRAMUSCULAR | Status: AC
  Filled 2023-04-07: qty 2

## 2023-04-07 MED ORDER — MIDAZOLAM HCL 2 MG/2ML IJ SOLN
INTRAMUSCULAR | Status: DC | PRN
  Administered 2023-04-07: 1 mg via INTRAVENOUS

## 2023-04-07 MED ORDER — CEFUROXIME OPHTHALMIC INJECTION 1 MG/0.1 ML
INJECTION | OPHTHALMIC | Status: DC | PRN
  Administered 2023-04-07: .1 mL via INTRACAMERAL

## 2023-04-07 MED ORDER — SIGHTPATH DOSE#1 BSS IO SOLN
INTRAOCULAR | Status: DC | PRN
  Administered 2023-04-07: 62 mL via OPHTHALMIC

## 2023-04-07 MED ORDER — LACTATED RINGERS IV SOLN: INTRAVENOUS | Status: DC

## 2023-04-07 MED ORDER — FENTANYL CITRATE (PF) 100 MCG/2ML IJ SOLN
INTRAMUSCULAR | Status: DC | PRN
  Administered 2023-04-07: 50 ug via INTRAVENOUS

## 2023-04-07 MED ORDER — MIDAZOLAM HCL 2 MG/2ML IJ SOLN
INTRAMUSCULAR | Status: AC
  Filled 2023-04-07: qty 2

## 2023-04-07 SURGICAL SUPPLY — 18 items
CANNULA ANT/CHMB 27G (MISCELLANEOUS) IMPLANT
CANNULA ANT/CHMB 27GA (MISCELLANEOUS)
CATARACT SUITE SIGHTPATH (MISCELLANEOUS) ×1
FEE CATARACT SUITE SIGHTPATH (MISCELLANEOUS) ×1 IMPLANT
GLOVE SRG 8 PF TXTR STRL LF DI (GLOVE) ×1 IMPLANT
GLOVE SURG ENC TEXT LTX SZ7.5 (GLOVE) ×1 IMPLANT
GLOVE SURG GAMMEX PI TX LF 7.5 (GLOVE) IMPLANT
GLOVE SURG UNDER POLY LF SZ8 (GLOVE) ×1
LENS IOL TECNIS EYHANCE 19.0 (Intraocular Lens) IMPLANT
NDL FILTER BLUNT 18X1 1/2 (NEEDLE) ×1 IMPLANT
NDL RETROBULBAR .5 NSTRL (NEEDLE) IMPLANT
NEEDLE FILTER BLUNT 18X1 1/2 (NEEDLE) ×1
PACK VIT ANT 23G (MISCELLANEOUS) IMPLANT
RING MALYGIN 7.0 (MISCELLANEOUS) IMPLANT
SUT ETHILON 10-0 CS-B-6CS-B-6 (SUTURE)
SUT VICRYL 9 0 (SUTURE) IMPLANT
SUTURE EHLN 10-0 CS-B-6CS-B-6 (SUTURE) IMPLANT
SYR 3ML LL SCALE MARK (SYRINGE) ×1 IMPLANT

## 2023-04-07 NOTE — Transfer of Care (Signed)
Immediate Anesthesia Transfer of Care Note  Patient: Claire Bishop  Procedure(s) Performed: CATARACT EXTRACTION PHACO AND INTRAOCULAR LENS PLACEMENT (IOC) RIGHT DIABETIC (Right: Eye)  Patient Location: PACU  Anesthesia Type:MAC  Level of Consciousness: awake and alert   Airway & Oxygen Therapy: Patient Spontanous Breathing  Post-op Assessment: Report given to RN and Post -op Vital signs reviewed and stable  Post vital signs: Reviewed and stable  Last Vitals:  Vitals Value Taken Time  BP 119/68 04/07/23 1015  Temp 96.8 10/02024  Pulse 65 04/07/23 1016  Resp 12 04/07/23 1016  SpO2 97 % 04/07/23 1016  Vitals shown include unfiled device data.  Last Pain:  Vitals:   04/07/23 0847  TempSrc: Temporal         Complications: No notable events documented.

## 2023-04-07 NOTE — H&P (Signed)
[facility]   Primary Care Physician:  Danella Penton, MD Ophthalmologist: Dr. Lockie Mola  Pre-Procedure History & Physical: HPI:  Claire Bishop is a 77 y.o. female here for ophthalmic surgery.   Past Medical History:  Diagnosis Date   Abnormal cervical Papanicolaou smear    Carpal tunnel syndrome    GERD (gastroesophageal reflux disease)    Hyperlipidemia    Hypertension    Osteoarthritis    Osteoporosis    Pre-diabetes     Past Surgical History:  Procedure Laterality Date   BREAST CYST ASPIRATION Left 1990   neg   BROW LIFT Bilateral 01/19/2020   Procedure: BLEPHAROPLASTY UPPER EYELID; W/EXCESS SKIN BILATERAL DIABETIC;  Surgeon: Imagene Riches, MD;  Location: [facility];  Service: Ophthalmology;  Laterality: Bilateral;  Bilateral Eyelids   COLONOSCOPY WITH PROPOFOL N/A 07/14/2017   Procedure: COLONOSCOPY WITH PROPOFOL;  Surgeon: Scot Jun, MD;  Location: [facility];  Service: Endoscopy;  Laterality: N/A;   KNEE ARTHROPLASTY Right 07/09/2021   Procedure: COMPUTER ASSISTED TOTAL KNEE ARTHROPLASTY AND HARDWARE REMOVAL OF 2 SCREWS;  Surgeon: Donato Heinz, MD;  Location: [facility];  Service: Orthopedics;  Laterality: Right;   Left hip fracture Left 2009   Right knee fracture Right 1999    Prior to Admission medications   Medication Sig Start Date End Date Taking? Authorizing Provider  acetaminophen (TYLENOL) 500 MG tablet Take 1,000 mg by mouth every 6 (six) hours as needed for moderate pain.   Yes [provider]  aspirin EC 81 MG tablet Take 81 mg by mouth daily. Swallow whole.   Yes [provider]  calcium-vitamin D (OSCAL WITH D) 500-200 MG-UNIT tablet Take 1 tablet by mouth.   Yes [provider]  Cholecalciferol 125 MCG (5000 UT) TABS Take by mouth.   Yes [provider]  etodolac (LODINE XL) 400 MG 24 hr tablet Take 400 mg by mouth daily.   Yes [provider]  hydrochlorothiazide  (HYDRODIURIL) 25 MG tablet Take 25 mg by mouth daily.   Yes [provider]  hydrOXYzine (ATARAX) 25 MG tablet Take 25 mg by mouth daily. 03/30/23 04/12/23 Yes [provider]  Multiple Vitamins-Minerals (ICAPS AREDS 2 PO) Take by mouth.   Yes [provider]  pantoprazole (PROTONIX) 40 MG tablet Take 40 mg by mouth daily as needed (acid reflux).   Yes [provider]  rivastigmine (EXELON) 1.5 MG capsule Take 1.5 mg by mouth 2 (two) times daily.   Yes [provider]  Semaglutide (OZEMPIC, 0.25 OR 0.5 MG/DOSE, ) Inject into the skin once a week. Mondays   Yes [provider]    Allergies as of 03/15/2023 - Review Complete 07/09/2021  Allergen Reaction Noted   Ultram [tramadol]  07/13/2017    Family History  Problem Relation Age of Onset   Breast cancer Neg Hx     Social History   Socioeconomic History   Marital status: Married    Spouse name: Not on file   Number of children: Not on file   Years of education: Not on file   Highest education level: Not on file  Occupational History   Not on file  Tobacco Use   Smoking status: Former    Current packs/day: 0.00    Types: Cigarettes    Quit date: 07/06/1988    Years since quitting: 34.7   Smokeless tobacco: Never  Vaping Use   Vaping status: Never Used  Substance and  Sexual Activity   Alcohol use: Yes    Alcohol/week: 2.0 standard drinks of alcohol    Types: 1 Glasses of wine, 1 Standard drinks or equivalent per week    Comment: occasionally   Drug use: Bishop   Sexual activity: Not on file  Other Topics Concern   Not on file  Social History Narrative   Not on file   Social Determinants of Health   Financial Resource Strain: Not on file  Food Insecurity: Not on file  Transportation Needs: Not on file  Physical Activity: Not on file  Stress: Not on file  Social Connections: Not on file  Intimate Partner Violence: Not on file    Review of Systems: See HPI,  otherwise negative ROS  Physical Exam: BP (!) 142/82   Pulse 73   Temp (!) 97.3 F (36.3 C) (Temporal)   Resp 18   Ht 5\' 3"  (1.6 m)   Wt 73 kg   SpO2 96%   BMI 28.52 kg/m  General:   Alert,  pleasant and cooperative in NAD Head:  Normocephalic and atraumatic. Lungs:  Clear to auscultation.    Heart:  Regular rate and rhythm.   Impression/Plan: Claire Bishop is here for ophthalmic surgery.  Risks, benefits, limitations, and alternatives regarding ophthalmic surgery have been reviewed with the patient.  Questions have been answered.  All parties agreeable.   Lockie Mola, MD  04/07/2023, 8:54 AM

## 2023-04-07 NOTE — Op Note (Signed)
LOCATION:  [facility]   PREOPERATIVE DIAGNOSIS:    Nuclear sclerotic cataract right eye. H25.11   POSTOPERATIVE DIAGNOSIS:  Nuclear sclerotic cataract right eye.     PROCEDURE:  Phacoemusification with posterior chamber intraocular lens placement of the right eye   ULTRASOUND TIME: Procedure(s) with comments: CATARACT EXTRACTION PHACO AND INTRAOCULAR LENS PLACEMENT (IOC) RIGHT DIABETIC (Right) - 11.99 1:07.0  LENS:   Implant Name Type Inv. Item Serial No. Manufacturer Lot No. LRB No. Used Action  LENS IOL TECNIS EYHANCE 19.0 - [id number] Intraocular Lens LENS IOL TECNIS EYHANCE 19.0 [id number] [employer]  Right 1 Implanted         SURGEON:  Deirdre Evener, MD   ANESTHESIA:  Topical with tetracaine drops and 2% Xylocaine jelly, augmented with 1% preservative-free intracameral lidocaine.    COMPLICATIONS:  None.   DESCRIPTION OF PROCEDURE:  The patient was identified in the holding room and transported to the operating room and placed in the supine position under the operating microscope.  The right eye was identified as the operative eye and it was prepped and draped in the usual sterile ophthalmic fashion.   A 1 millimeter clear-corneal paracentesis was made at the 12:00 position.  0.5 ml of preservative-free 1% lidocaine was injected into the anterior chamber. The anterior chamber was filled with Viscoat viscoelastic.  A 2.4 millimeter keratome was used to make a near-clear corneal incision at the 9:00 position.  A curvilinear capsulorrhexis was made with a cystotome and capsulorrhexis forceps.  Balanced salt solution was used to hydrodissect and hydrodelineate the nucleus.   Phacoemulsification was then used in stop and chop fashion to remove the lens nucleus and epinucleus.  The remaining cortex was then removed using the irrigation and aspiration handpiece. Provisc was then placed into the capsular bag to distend it for lens placement.  A lens was then injected  into the capsular bag.  The remaining viscoelastic was aspirated.   Wounds were hydrated with balanced salt solution.  The anterior chamber was inflated to a physiologic pressure with balanced salt solution.  No wound leaks were noted. Cefuroxime 0.1 ml of a 10mg /ml solution was injected into the anterior chamber for a dose of 1 mg of intracameral antibiotic at the completion of the case.   Timolol and Brimonidine drops were applied to the eye.  The patient was taken to the recovery room in stable condition without complications of anesthesia or surgery.   Algis Lehenbauer 04/07/2023, 10:12 AM

## 2023-04-07 NOTE — Anesthesia Postprocedure Evaluation (Signed)
Anesthesia Post Note  Patient: Claire Bishop  Procedure(s) Performed: CATARACT EXTRACTION PHACO AND INTRAOCULAR LENS PLACEMENT (IOC) RIGHT DIABETIC (Right: Eye)  Patient location during evaluation: PACU Anesthesia Type: MAC Level of consciousness: awake and alert Pain management: pain level controlled Vital Signs Assessment: post-procedure vital signs reviewed and stable Respiratory status: spontaneous breathing, nonlabored ventilation, respiratory function stable and patient connected to nasal cannula oxygen Cardiovascular status: stable and blood pressure returned to baseline Postop Assessment: no apparent nausea or vomiting Anesthetic complications: no   No notable events documented.   Last Vitals:  Vitals:   04/07/23 1016 04/07/23 1021  BP:  (!) 123/92  Pulse: (!) 59 69  Resp:  13  Temp:    SpO2: 92% 92%    Last Pain:  Vitals:   04/07/23 0847  TempSrc: Temporal                 Damian Hofstra C Justinian Miano

## 2023-04-08 ENCOUNTER — Encounter: Payer: Self-pay | Admitting: Ophthalmology

## 2023-04-12 NOTE — Anesthesia Preprocedure Evaluation (Addendum)
Anesthesia Evaluation  Patient identified by MRN, date of birth, ID band Patient awake    Reviewed: Allergy & Precautions, H&P , NPO status , Patient's Chart, lab work & pertinent test results  Airway Mallampati: III  TM Distance: <3 FB Neck ROM: Full    Dental no notable dental hx. (+) Caps   Pulmonary neg pulmonary ROS, former smoker   Pulmonary exam normal breath sounds clear to auscultation       Cardiovascular hypertension, Normal cardiovascular exam Rhythm:Regular Rate:Normal     Neuro/Psych  Neuromuscular disease  negative psych ROS   GI/Hepatic Neg liver ROS,GERD  ,,  Endo/Other  diabetes    Renal/GU negative Renal ROS  negative genitourinary   Musculoskeletal  (+) Arthritis ,    Abdominal   Peds negative pediatric ROS (+)  Hematology negative hematology ROS (+)   Anesthesia Other Findings Previous cataract surgery 04-07-23 with Dr. Juel Burrow anesthesiologist Hyperlipidemia  Carpal tunnel syndrome Osteoarthritis  Osteoporosis  Pre-diabetes Hypertension  GERD (gastroesophageal reflux disease)    Reproductive/Obstetrics negative OB ROS                             Anesthesia Physical Anesthesia Plan  ASA: 2  Anesthesia Plan: MAC   Post-op Pain Management:    Induction: Intravenous  PONV Risk Score and Plan:   Airway Management Planned: Natural Airway and Nasal Cannula  Additional Equipment:   Intra-op Plan:   Post-operative Plan:   Informed Consent: I have reviewed the patients History and Physical, chart, labs and discussed the procedure including the risks, benefits and alternatives for the proposed anesthesia with the patient or authorized representative who has indicated his/her understanding and acceptance.     Dental Advisory Given  Plan Discussed with: Anesthesiologist, CRNA and Surgeon  Anesthesia Plan Comments: (Patient consented for risks of  anesthesia including but not limited to:  - adverse reactions to medications - damage to eyes, teeth, lips or other oral mucosa - nerve damage due to positioning  - sore throat or hoarseness - Damage to heart, brain, nerves, lungs, other parts of body or loss of life  Patient voiced understanding.)        Anesthesia Quick Evaluation

## 2023-04-13 DIAGNOSIS — H2512 Age-related nuclear cataract, left eye: Secondary | ICD-10-CM | POA: Diagnosis not present

## 2023-04-20 NOTE — Discharge Instructions (Signed)

## 2023-04-21 ENCOUNTER — Ambulatory Visit: Payer: PPO | Admitting: Anesthesiology

## 2023-04-21 ENCOUNTER — Ambulatory Visit
Admission: RE | Admit: 2023-04-21 | Discharge: 2023-04-21 | Disposition: A | Discharge status: Home / Self Care | Payer: PPO | Attending: Ophthalmology | Admitting: Ophthalmology

## 2023-04-21 ENCOUNTER — Encounter
Admission: RE | Disposition: A | Discharge status: Home / Self Care | Payer: Self-pay | Source: Home / Self Care | Attending: Ophthalmology

## 2023-04-21 ENCOUNTER — Encounter: Payer: Self-pay | Admitting: Ophthalmology

## 2023-04-21 ENCOUNTER — Other Ambulatory Visit: Payer: Self-pay

## 2023-04-21 DIAGNOSIS — Z7985 Long-term (current) use of injectable non-insulin antidiabetic drugs: Secondary | ICD-10-CM | POA: Insufficient documentation

## 2023-04-21 DIAGNOSIS — K219 Gastro-esophageal reflux disease without esophagitis: Secondary | ICD-10-CM | POA: Insufficient documentation

## 2023-04-21 DIAGNOSIS — Z87891 Personal history of nicotine dependence: Secondary | ICD-10-CM | POA: Insufficient documentation

## 2023-04-21 DIAGNOSIS — H2512 Age-related nuclear cataract, left eye: Secondary | ICD-10-CM | POA: Diagnosis not present

## 2023-04-21 DIAGNOSIS — E1122 Type 2 diabetes mellitus with diabetic chronic kidney disease: Secondary | ICD-10-CM | POA: Diagnosis not present

## 2023-04-21 DIAGNOSIS — M1990 Unspecified osteoarthritis, unspecified site: Secondary | ICD-10-CM | POA: Insufficient documentation

## 2023-04-21 DIAGNOSIS — I10 Essential (primary) hypertension: Secondary | ICD-10-CM | POA: Insufficient documentation

## 2023-04-21 DIAGNOSIS — E1136 Type 2 diabetes mellitus with diabetic cataract: Secondary | ICD-10-CM | POA: Insufficient documentation

## 2023-04-21 HISTORY — PX: CATARACT EXTRACTION W/PHACO: SHX586

## 2023-04-21 LAB — GLUCOSE, CAPILLARY: Glucose-Capillary: 108 mg/dL — ABNORMAL HIGH (ref 70–99)

## 2023-04-21 SURGERY — PHACOEMULSIFICATION, CATARACT, WITH IOL INSERTION
Anesthesia: Monitor Anesthesia Care | Laterality: Left

## 2023-04-21 MED ORDER — SIGHTPATH DOSE#1 BSS IO SOLN
INTRAOCULAR | Status: DC | PRN
  Administered 2023-04-21: 2 mL

## 2023-04-21 MED ORDER — SIGHTPATH DOSE#1 BSS IO SOLN
INTRAOCULAR | Status: DC | PRN
  Administered 2023-04-21: 71 mL via OPHTHALMIC

## 2023-04-21 MED ORDER — LACTATED RINGERS IV SOLN: INTRAVENOUS | Status: DC

## 2023-04-21 MED ORDER — SIGHTPATH DOSE#1 BSS IO SOLN
INTRAOCULAR | Status: DC | PRN
  Administered 2023-04-21: 15 mL via INTRAOCULAR

## 2023-04-21 MED ORDER — FENTANYL CITRATE (PF) 100 MCG/2ML IJ SOLN
INTRAMUSCULAR | Status: AC
  Filled 2023-04-21: qty 2

## 2023-04-21 MED ORDER — BRIMONIDINE TARTRATE-TIMOLOL 0.2-0.5 % OP SOLN
OPHTHALMIC | Status: DC | PRN
  Administered 2023-04-21: 1 [drp] via OPHTHALMIC

## 2023-04-21 MED ORDER — FENTANYL CITRATE (PF) 100 MCG/2ML IJ SOLN
INTRAMUSCULAR | Status: DC | PRN
  Administered 2023-04-21: 50 ug via INTRAVENOUS

## 2023-04-21 MED ORDER — TETRACAINE HCL 0.5 % OP SOLN
1.0000 [drp] | OPHTHALMIC | Status: DC | PRN
  Administered 2023-04-21 (×3): 1 [drp] via OPHTHALMIC

## 2023-04-21 MED ORDER — ARMC OPHTHALMIC DILATING DROPS
1.0000 | OPHTHALMIC | Status: DC | PRN
  Administered 2023-04-21 (×3): 1 via OPHTHALMIC

## 2023-04-21 MED ORDER — TETRACAINE HCL 0.5 % OP SOLN
OPHTHALMIC | Status: AC
  Filled 2023-04-21: qty 4

## 2023-04-21 MED ORDER — MIDAZOLAM HCL 2 MG/2ML IJ SOLN
INTRAMUSCULAR | Status: DC | PRN
  Administered 2023-04-21: 2 mg via INTRAVENOUS

## 2023-04-21 MED ORDER — CEFUROXIME OPHTHALMIC INJECTION 1 MG/0.1 ML
INJECTION | OPHTHALMIC | Status: DC | PRN
  Administered 2023-04-21: 1 mg via INTRACAMERAL

## 2023-04-21 MED ORDER — MIDAZOLAM HCL 2 MG/2ML IJ SOLN
INTRAMUSCULAR | Status: AC
  Filled 2023-04-21: qty 2

## 2023-04-21 MED ORDER — ARMC OPHTHALMIC DILATING DROPS
OPHTHALMIC | Status: AC
  Filled 2023-04-21: qty 0.5

## 2023-04-21 MED ORDER — SIGHTPATH DOSE#1 NA HYALUR & NA CHOND-NA HYALUR IO KIT
PACK | INTRAOCULAR | Status: DC | PRN
  Administered 2023-04-21: 1 via OPHTHALMIC

## 2023-04-21 SURGICAL SUPPLY — 9 items
CATARACT SUITE SIGHTPATH (MISCELLANEOUS) ×1
FEE CATARACT SUITE SIGHTPATH (MISCELLANEOUS) ×1 IMPLANT
GLOVE SRG 8 PF TXTR STRL LF DI (GLOVE) ×1 IMPLANT
GLOVE SURG ENC TEXT LTX SZ7.5 (GLOVE) ×1 IMPLANT
GLOVE SURG UNDER POLY LF SZ8 (GLOVE) ×1
LENS IOL TECNIS EYHANCE 20.0 (Intraocular Lens) IMPLANT
NDL FILTER BLUNT 18X1 1/2 (NEEDLE) ×1 IMPLANT
NEEDLE FILTER BLUNT 18X1 1/2 (NEEDLE) ×1
SYR 3ML LL SCALE MARK (SYRINGE) ×1 IMPLANT

## 2023-04-21 NOTE — H&P (Signed)
[facility]   Primary Care Physician:  Danella Penton, MD Ophthalmologist: Dr. Lockie Mola  Pre-Procedure History & Physical: HPI:  Claire Bishop is a 77 y.o. female here for ophthalmic surgery.   Past Medical History:  Diagnosis Date   Abnormal cervical Papanicolaou smear    Carpal tunnel syndrome    GERD (gastroesophageal reflux disease)    Hyperlipidemia    Hypertension    Osteoarthritis    Osteoporosis    Pre-diabetes     Past Surgical History:  Procedure Laterality Date   BREAST CYST ASPIRATION Left 1990   neg   BROW LIFT Bilateral 01/19/2020   Procedure: BLEPHAROPLASTY UPPER EYELID; W/EXCESS SKIN BILATERAL DIABETIC;  Surgeon: Imagene Riches, MD;  Location: [facility];  Service: Ophthalmology;  Laterality: Bilateral;  Bilateral Eyelids   CATARACT EXTRACTION W/PHACO Right 04/07/2023   Procedure: CATARACT EXTRACTION PHACO AND INTRAOCULAR LENS PLACEMENT (IOC) RIGHT DIABETIC;  Surgeon: Lockie Mola, MD;  Location: [facility];  Service: Ophthalmology;  Laterality: Right;  11.99 1:07.0   COLONOSCOPY WITH PROPOFOL N/A 07/14/2017   Procedure: COLONOSCOPY WITH PROPOFOL;  Surgeon: Scot Jun, MD;  Location: [facility];  Service: Endoscopy;  Laterality: N/A;   KNEE ARTHROPLASTY Right 07/09/2021   Procedure: COMPUTER ASSISTED TOTAL KNEE ARTHROPLASTY AND HARDWARE REMOVAL OF 2 SCREWS;  Surgeon: Donato Heinz, MD;  Location: [facility] ORS;  Service: Orthopedics;  Laterality: Right;   Left hip fracture Left 2009   Right knee fracture Right 1999    Prior to Admission medications   Medication Sig Start Date End Date Taking? Authorizing Provider  acetaminophen (TYLENOL) 500 MG tablet Take 1,000 mg by mouth every 6 (six) hours as needed for moderate pain.   Yes [provider]  aspirin EC 81 MG tablet Take 81 mg by mouth daily. Swallow whole.   Yes [provider]  calcium-vitamin D (OSCAL WITH D) 500-200 MG-UNIT tablet Take  1 tablet by mouth.   Yes [provider]  Cholecalciferol 125 MCG (5000 UT) TABS Take by mouth.   Yes [provider]  etodolac (LODINE XL) 400 MG 24 hr tablet Take 400 mg by mouth daily.   Yes [provider]  hydrochlorothiazide (HYDRODIURIL) 25 MG tablet Take 25 mg by mouth daily.   Yes [provider]  Multiple Vitamins-Minerals (ICAPS AREDS 2 PO) Take by mouth.   Yes [provider]  pantoprazole (PROTONIX) 40 MG tablet Take 40 mg by mouth daily as needed (acid reflux).   Yes [provider]  rivastigmine (EXELON) 1.5 MG capsule Take 1.5 mg by mouth 2 (two) times daily.   Yes [provider]  Semaglutide (OZEMPIC, 0.25 OR 0.5 MG/DOSE, [place]) Inject into the skin once a week. Mondays   Yes [provider]    Allergies as of 03/15/2023 - Review Complete 07/09/2021  Allergen Reaction Noted   Ultram [tramadol]  07/13/2017    Family History  Problem Relation Age of Onset   Breast cancer Neg Hx     Social History   Socioeconomic History   Marital status: Married    Spouse name: Not on file   Number of children: Not on file   Years of education: Not on file   Highest education level: Not on file  Occupational History   Not on file  Tobacco Use   Smoking status: Former    Current packs/day: 0.00    Types: Cigarettes    Quit date: 07/06/1988  Years since quitting: 34.8   Smokeless tobacco: Never  Vaping Use   Vaping status: Never Used  Substance and Sexual Activity   Alcohol use: Yes    Alcohol/week: 2.0 standard drinks of alcohol    Types: 1 Glasses of wine, 1 Standard drinks or equivalent per week    Comment: occasionally   Drug use: Bishop   Sexual activity: Not on file  Other Topics Concern   Not on file  Social History Narrative   Not on file   Social Determinants of Health   Financial Resource Strain: Not on file  Food Insecurity: Not on file  Transportation Needs: Not on file  Physical  Activity: Not on file  Stress: Not on file  Social Connections: Not on file  Intimate Partner Violence: Not on file    Review of Systems: See HPI, otherwise negative ROS  Physical Exam: Ht 5\' 3"  (1.6 m)   Wt 73 kg   BMI 28.52 kg/m  General:   Alert,  pleasant and cooperative in NAD Head:  Normocephalic and atraumatic. Lungs:  Clear to auscultation.    Heart:  Regular rate and rhythm.   Impression/Plan: Claire Bishop is here for ophthalmic surgery.  Risks, benefits, limitations, and alternatives regarding ophthalmic surgery have been reviewed with the patient.  Questions have been answered.  All parties agreeable.   Lockie Mola, MD  04/21/2023, 7:36 AM

## 2023-04-21 NOTE — Op Note (Signed)
OPERATIVE NOTE  Claire Bishop [id number] 04/21/2023   PREOPERATIVE DIAGNOSIS:  Nuclear sclerotic cataract left eye. H25.12   POSTOPERATIVE DIAGNOSIS:    Nuclear sclerotic cataract left eye.     PROCEDURE:  Phacoemusification with posterior chamber intraocular lens placement of the left eye  Ultrasound time: Procedure(s): CATARACT EXTRACTION PHACO AND INTRAOCULAR LENS PLACEMENT (IOC) LEFT DIABETIC 7.25 00:30.3 (Left)  LENS:   Implant Name Type Inv. Item Serial No. Manufacturer Lot No. LRB No. Used Action  LENS IOL TECNIS EYHANCE 20.0 - [id number] Intraocular Lens LENS IOL TECNIS EYHANCE 20.0 [id number] [employer]  Left 1 Implanted      SURGEON:  Deirdre Evener, MD   ANESTHESIA:  Topical with tetracaine drops and 2% Xylocaine jelly, augmented with 1% preservative-free intracameral lidocaine.   . COMPLICATIONS:  None.   DESCRIPTION OF PROCEDURE:  The patient was identified in the holding room and transported to the operating room and placed in the supine position under the operating microscope.  The left eye was identified as the operative eye and it was prepped and draped in the usual sterile ophthalmic fashion.   A 1 millimeter clear-corneal paracentesis was made at the 1:30 position.  0.5 ml of preservative-free 1% lidocaine was injected into the anterior chamber.  The anterior chamber was filled with Viscoat viscoelastic.  A 2.4 millimeter keratome was used to make a near-clear corneal incision at the 10:30 position.  .  A curvilinear capsulorrhexis was made with a cystotome and capsulorrhexis forceps.  Balanced salt solution was used to hydrodissect and hydrodelineate the nucleus.   Phacoemulsification was then used in stop and chop fashion to remove the lens nucleus and epinucleus.  The remaining cortex was then removed using the irrigation and aspiration handpiece. Provisc was then placed into the capsular bag to distend it for lens placement.  A lens was then injected  into the capsular bag.  The remaining viscoelastic was aspirated.   Wounds were hydrated with balanced salt solution.  The anterior chamber was inflated to a physiologic pressure with balanced salt solution.  No wound leaks were noted. Cefuroxime 0.1 ml of a 10mg /ml solution was injected into the anterior chamber for a dose of 1 mg of intracameral antibiotic at the completion of the case.   Timolol and Brimonidine drops were applied to the eye.  The patient was taken to the recovery room in stable condition without complications of anesthesia or surgery.  [name] 04/21/2023, 8:41 AM

## 2023-04-21 NOTE — Transfer of Care (Signed)
Immediate Anesthesia Transfer of Care Note  Patient: Claire Bishop  Procedure(s) Performed: CATARACT EXTRACTION PHACO AND INTRAOCULAR LENS PLACEMENT (IOC) LEFT DIABETIC 7.25 00:30.3 (Left)  Patient Location: PACU  Anesthesia Type:MAC  Level of Consciousness: awake, alert , and oriented  Airway & Oxygen Therapy: Patient Spontanous Breathing  Post-op Assessment: Report given to RN and Post -op Vital signs reviewed and stable  Post vital signs: Reviewed and stable  Last Vitals:  Vitals Value Taken Time  BP    Temp    Pulse    Resp    SpO2      Last Pain:  Vitals:   04/21/23 0735  TempSrc: Temporal  PainSc: 0-No pain         Complications: No notable events documented.

## 2023-04-21 NOTE — Anesthesia Postprocedure Evaluation (Signed)
Anesthesia Post Note  Patient: Claire Bishop  Procedure(s) Performed: CATARACT EXTRACTION PHACO AND INTRAOCULAR LENS PLACEMENT (IOC) LEFT DIABETIC 7.25 00:30.3 (Left)  Patient location during evaluation: PACU Anesthesia Type: MAC Level of consciousness: awake and alert Pain management: pain level controlled Vital Signs Assessment: post-procedure vital signs reviewed and stable Respiratory status: spontaneous breathing, nonlabored ventilation, respiratory function stable and patient connected to nasal cannula oxygen Cardiovascular status: stable and blood pressure returned to baseline Postop Assessment: no apparent nausea or vomiting Anesthetic complications: no   No notable events documented.   Last Vitals:  Vitals:   04/21/23 0842 04/21/23 0848  BP: 109/76 101/79  Pulse: 67 68  Resp: 18 20  Temp: (!) 36.3 C (!) 36.4 C  SpO2: 95% 97%    Last Pain:  Vitals:   04/21/23 0848  TempSrc:   PainSc: 0-No pain                 Claire Bishop

## 2023-04-22 ENCOUNTER — Encounter: Payer: Self-pay | Admitting: Ophthalmology

## 2023-04-28 NOTE — Plan of Care (Signed)
[facility] Tonsillectomy/Adenoidectomy, Postoperative PEDS care plan entered in error.

## 2023-05-14 DIAGNOSIS — H353132 Nonexudative age-related macular degeneration, bilateral, intermediate dry stage: Secondary | ICD-10-CM | POA: Diagnosis not present

## 2023-05-14 DIAGNOSIS — H43813 Vitreous degeneration, bilateral: Secondary | ICD-10-CM | POA: Diagnosis not present

## 2023-05-14 DIAGNOSIS — E119 Type 2 diabetes mellitus without complications: Secondary | ICD-10-CM | POA: Diagnosis not present

## 2023-05-14 DIAGNOSIS — Z961 Presence of intraocular lens: Secondary | ICD-10-CM | POA: Diagnosis not present

## 2023-05-14 DIAGNOSIS — H2513 Age-related nuclear cataract, bilateral: Secondary | ICD-10-CM | POA: Diagnosis not present

## 2023-05-14 DIAGNOSIS — Z0100 Encounter for examination of eyes and vision without abnormal findings: Secondary | ICD-10-CM | POA: Diagnosis not present

## 2023-06-08 DIAGNOSIS — L570 Actinic keratosis: Secondary | ICD-10-CM | POA: Diagnosis not present

## 2023-06-08 DIAGNOSIS — L814 Other melanin hyperpigmentation: Secondary | ICD-10-CM | POA: Diagnosis not present

## 2023-06-08 DIAGNOSIS — D692 Other nonthrombocytopenic purpura: Secondary | ICD-10-CM | POA: Diagnosis not present

## 2023-06-08 DIAGNOSIS — L820 Inflamed seborrheic keratosis: Secondary | ICD-10-CM | POA: Diagnosis not present

## 2023-06-08 DIAGNOSIS — L813 Cafe au lait spots: Secondary | ICD-10-CM | POA: Diagnosis not present

## 2023-06-08 DIAGNOSIS — L821 Other seborrheic keratosis: Secondary | ICD-10-CM | POA: Diagnosis not present

## 2023-06-08 DIAGNOSIS — D1801 Hemangioma of skin and subcutaneous tissue: Secondary | ICD-10-CM | POA: Diagnosis not present

## 2023-06-15 DIAGNOSIS — R0609 Other forms of dyspnea: Secondary | ICD-10-CM | POA: Diagnosis not present

## 2023-06-15 DIAGNOSIS — J45902 Unspecified asthma with status asthmaticus: Secondary | ICD-10-CM | POA: Diagnosis not present

## 2023-06-15 DIAGNOSIS — J188 Other pneumonia, unspecified organism: Secondary | ICD-10-CM | POA: Diagnosis not present

## 2023-06-15 DIAGNOSIS — E1169 Type 2 diabetes mellitus with other specified complication: Secondary | ICD-10-CM | POA: Diagnosis not present

## 2023-06-15 DIAGNOSIS — E782 Mixed hyperlipidemia: Secondary | ICD-10-CM | POA: Diagnosis not present

## 2023-06-15 DIAGNOSIS — J984 Other disorders of lung: Secondary | ICD-10-CM | POA: Diagnosis not present

## 2023-06-21 DIAGNOSIS — E782 Mixed hyperlipidemia: Secondary | ICD-10-CM | POA: Diagnosis not present

## 2023-06-21 DIAGNOSIS — J40 Bronchitis, not specified as acute or chronic: Secondary | ICD-10-CM | POA: Diagnosis not present

## 2023-06-21 DIAGNOSIS — J45902 Unspecified asthma with status asthmaticus: Secondary | ICD-10-CM | POA: Diagnosis not present

## 2023-06-21 DIAGNOSIS — E1169 Type 2 diabetes mellitus with other specified complication: Secondary | ICD-10-CM | POA: Diagnosis not present

## 2023-07-15 DIAGNOSIS — E1169 Type 2 diabetes mellitus with other specified complication: Secondary | ICD-10-CM | POA: Diagnosis not present

## 2023-07-15 DIAGNOSIS — E782 Mixed hyperlipidemia: Secondary | ICD-10-CM | POA: Diagnosis not present

## 2023-07-15 DIAGNOSIS — Z96651 Presence of right artificial knee joint: Secondary | ICD-10-CM | POA: Diagnosis not present

## 2023-08-10 DIAGNOSIS — E538 Deficiency of other specified B group vitamins: Secondary | ICD-10-CM | POA: Diagnosis not present

## 2023-08-10 DIAGNOSIS — E782 Mixed hyperlipidemia: Secondary | ICD-10-CM | POA: Diagnosis not present

## 2023-08-10 DIAGNOSIS — E1169 Type 2 diabetes mellitus with other specified complication: Secondary | ICD-10-CM | POA: Diagnosis not present

## 2023-08-17 DIAGNOSIS — Z0000 Encounter for general adult medical examination without abnormal findings: Secondary | ICD-10-CM | POA: Diagnosis not present

## 2023-08-17 DIAGNOSIS — E782 Mixed hyperlipidemia: Secondary | ICD-10-CM | POA: Diagnosis not present

## 2023-08-17 DIAGNOSIS — E1169 Type 2 diabetes mellitus with other specified complication: Secondary | ICD-10-CM | POA: Diagnosis not present

## 2023-08-17 DIAGNOSIS — E538 Deficiency of other specified B group vitamins: Secondary | ICD-10-CM | POA: Diagnosis not present

## 2023-11-18 DIAGNOSIS — H353132 Nonexudative age-related macular degeneration, bilateral, intermediate dry stage: Secondary | ICD-10-CM | POA: Diagnosis not present

## 2023-11-18 DIAGNOSIS — Z961 Presence of intraocular lens: Secondary | ICD-10-CM | POA: Diagnosis not present

## 2023-11-18 DIAGNOSIS — H43813 Vitreous degeneration, bilateral: Secondary | ICD-10-CM | POA: Diagnosis not present

## 2023-11-18 DIAGNOSIS — E119 Type 2 diabetes mellitus without complications: Secondary | ICD-10-CM | POA: Diagnosis not present

## 2024-02-08 DIAGNOSIS — E782 Mixed hyperlipidemia: Secondary | ICD-10-CM | POA: Diagnosis not present

## 2024-02-08 DIAGNOSIS — E538 Deficiency of other specified B group vitamins: Secondary | ICD-10-CM | POA: Diagnosis not present

## 2024-02-08 DIAGNOSIS — E1169 Type 2 diabetes mellitus with other specified complication: Secondary | ICD-10-CM | POA: Diagnosis not present

## 2024-02-15 DIAGNOSIS — E782 Mixed hyperlipidemia: Secondary | ICD-10-CM | POA: Diagnosis not present

## 2024-02-15 DIAGNOSIS — Z79899 Other long term (current) drug therapy: Secondary | ICD-10-CM | POA: Diagnosis not present

## 2024-02-15 DIAGNOSIS — G3184 Mild cognitive impairment, so stated: Secondary | ICD-10-CM | POA: Diagnosis not present

## 2024-02-15 DIAGNOSIS — E538 Deficiency of other specified B group vitamins: Secondary | ICD-10-CM | POA: Diagnosis not present

## 2024-02-15 DIAGNOSIS — E1169 Type 2 diabetes mellitus with other specified complication: Secondary | ICD-10-CM | POA: Diagnosis not present

## 2024-04-27 ENCOUNTER — Other Ambulatory Visit: Payer: Self-pay | Admitting: Internal Medicine

## 2024-04-27 DIAGNOSIS — Z1231 Encounter for screening mammogram for malignant neoplasm of breast: Secondary | ICD-10-CM

## 2024-05-07 ENCOUNTER — Telehealth: Admitting: Physician Assistant

## 2024-05-07 DIAGNOSIS — M25561 Pain in right knee: Secondary | ICD-10-CM | POA: Diagnosis not present

## 2024-05-07 MED ORDER — PREDNISONE 20 MG PO TABS: 40.0000 mg | ORAL_TABLET | Freq: Every day | ORAL | 0 refills | Status: AC

## 2024-05-07 NOTE — Patient Instructions (Signed)
 Claire Bishop, thank you for joining Claire Velma Lunger, PA-C for today's virtual visit.  While this provider is not your primary care provider (PCP), if your PCP is located in our provider database this encounter information will be shared with them immediately following your visit.   A [facility] MyChart account gives you access to today's visit and all your visits, tests, and labs performed at [facility]  click here if you don't have a [facility] MyChart account or go to [email or web address]  Consent: (Patient) Claire Bishop provided verbal consent for this virtual visit at the beginning of the encounter.  Current Medications:  Current Outpatient Medications:    predniSONE (DELTASONE) 20 MG tablet, Take 2 tablets (40 mg total) by mouth daily with breakfast., Disp: 10 tablet, Rfl: 0   acetaminophen  (TYLENOL ) 500 MG tablet, Take 1,000 mg by mouth every 6 (six) hours as needed for moderate pain., Disp: , Rfl:    aspirin EC 81 MG tablet, Take 81 mg by mouth daily. Swallow whole., Disp: , Rfl:    calcium -vitamin D (OSCAL WITH D) 500-200 MG-UNIT tablet, Take 1 tablet by mouth., Disp: , Rfl:    Cholecalciferol  125 MCG (5000 UT) TABS, Take by mouth., Disp: , Rfl:    etodolac (LODINE XL) 400 MG 24 hr tablet, Take 400 mg by mouth daily., Disp: , Rfl:    hydrochlorothiazide  (HYDRODIURIL ) 25 MG tablet, Take 25 mg by mouth daily., Disp: , Rfl:    Multiple Vitamins-Minerals (ICAPS AREDS 2 PO), Take by mouth., Disp: , Rfl:    pantoprazole  (PROTONIX ) 40 MG tablet, Take 40 mg by mouth daily as needed (acid reflux)., Disp: , Rfl:    rivastigmine (EXELON) 1.5 MG capsule, Take 1.5 mg by mouth 2 (two) times daily., Disp: , Rfl:    Semaglutide (OZEMPIC, 0.25 OR 0.5 MG/DOSE, [place]), Inject into the skin once a week. Mondays, Disp: , Rfl:    Medications ordered in this encounter:  Meds ordered this encounter  Medications   predniSONE (DELTASONE) 20 MG tablet    Sig: Take 2 tablets (40  mg total) by mouth daily with breakfast.    Dispense:  10 tablet    Refill:  0    Supervising Provider:   BLAISE ALEENE KIDD [phone]     *If you need refills on other medications prior to your next appointment, please contact your pharmacy*  Follow-Up: Call back or seek an in-person evaluation if the symptoms worsen or if the condition fails to improve as anticipated.  [facility] [phone]  Other Instructions Continue to elevate the leg. Apply cool/cold compresses. Ok to continue OTC Tylenol . Take the prednisone as directed. Follow-up with your PCP or specialist this week for re-evaluation. If you note any non-resolving, new, or worsening symptoms despite treatment, please seek an in-person evaluation ASAP.    If you have been instructed to have an in-person evaluation today at a local Urgent Care facility, please use the link below. It will take you to a list of all of our available [facility], including address, phone number and hours of operation. Please do not delay care.  [facility]  If you or a family member do not have a primary care provider, use the link below to schedule a visit and establish care. When you choose a [facility] primary care physician or advanced practice provider, you gain a long-term partner in health. Find a Primary Care Provider  Learn more about  [facility]'s in-office and virtual care options: [facility] - Get Care Now

## 2024-05-07 NOTE — Progress Notes (Signed)
 Virtual Visit Consent   [name], you are scheduled for a virtual visit with a [facility] provider today. Just as with appointments in the office, your consent must be obtained to participate. Your consent will be active for this visit and any virtual visit you may have with one of our providers in the next 365 days. If you have a MyChart account, a copy of this consent can be sent to you electronically.  As this is a virtual visit, video technology does not allow for your provider to perform a traditional examination. This may limit your provider's ability to fully assess your condition. If your provider identifies any concerns that need to be evaluated in person or the need to arrange testing (such as labs, EKG, etc.), we will make arrangements to do so. Although advances in technology are sophisticated, we cannot ensure that it will always work on either your end or our end. If the connection with a video visit is poor, the visit may have to be switched to a telephone visit. With either a video or telephone visit, we are not always able to ensure that we have a secure connection.  By engaging in this virtual visit, you consent to the provision of healthcare and authorize for your insurance to be billed (if applicable) for the services provided during this visit. Depending on your insurance coverage, you may receive a charge related to this service.  I need to obtain your verbal consent now. Are you willing to proceed with your visit today? HELIA HAESE has provided verbal consent on 05/07/2024 for a virtual visit (video or telephone). Claire Bishop, [place]  Date: 05/07/2024 2:18 PM   Virtual Visit via Video Note   I, Claire Bishop, connected with  DONNIKA KUCHER  ([id number], 01-24-1946) on 05/07/24 at  2:15 PM EST by a video-enabled telemedicine application and verified that I am speaking with the correct person using two identifiers.  Location: Patient: Virtual Visit  Location Patient: Home Provider: Virtual Visit Location Provider: Home Office   I discussed the limitations of evaluation and management by telemedicine and the availability of in person appointments. The patient expressed understanding and agreed to proceed.    History of Present Illness: Claire Bishop is a 78 y.o. who identifies as a female who was assigned female at birth, with history of osteoarthritis of R knee s/p TKR in 2023, and is being seen today for pain and swelling of R knee. Endorses symptoms starting abruptly Friday evening into Saturday morning. Initially took some Aleve which made her sick on her stomach (resolved quickly). Continued symptoms Saturday and today with warmth and swelling. Notes she can flex and extend the knee but very painful to put weight on the knee. Denies any trauma or injury. No recent procedure. Denies fever, bruising of skin. Has not talked to her PCP or Ortho yet.      HPI: HPI  Problems:  Patient Active Problem List   Diagnosis Date Noted   Hx of abnormal cervical Pap smear 07/09/2021   Osteoporosis, post-menopausal 07/09/2021   Total knee replacement status 07/09/2021   Primary osteoarthritis of right knee 08/05/2018   DM type 2 with diabetic mixed hyperlipidemia (HCC) 02/28/2018   Gastroesophageal reflux disease without esophagitis 08/10/2017   B12 deficiency 06/23/2017   Hyperlipidemia, mixed 06/22/2016   Pelvic organ prolapse quantification stage 2 cystocele 07/31/2014    Allergies:  Allergies  Allergen Reactions   Donepezil     Dizziness  Ultram  [Tramadol ]     made me feel real funny   Medications:  Current Outpatient Medications:    predniSONE (DELTASONE) 20 MG tablet, Take 2 tablets (40 mg total) by mouth daily with breakfast., Disp: 10 tablet, Rfl: 0   acetaminophen  (TYLENOL ) 500 MG tablet, Take 1,000 mg by mouth every 6 (six) hours as needed for moderate pain., Disp: , Rfl:    aspirin EC 81 MG tablet, Take 81 mg by mouth  daily. Swallow whole., Disp: , Rfl:    calcium -vitamin D (OSCAL WITH D) 500-200 MG-UNIT tablet, Take 1 tablet by mouth., Disp: , Rfl:    Cholecalciferol  125 MCG (5000 UT) TABS, Take by mouth., Disp: , Rfl:    etodolac (LODINE XL) 400 MG 24 hr tablet, Take 400 mg by mouth daily., Disp: , Rfl:    hydrochlorothiazide  (HYDRODIURIL ) 25 MG tablet, Take 25 mg by mouth daily., Disp: , Rfl:    Multiple Vitamins-Minerals (ICAPS AREDS 2 PO), Take by mouth., Disp: , Rfl:    pantoprazole  (PROTONIX ) 40 MG tablet, Take 40 mg by mouth daily as needed (acid reflux)., Disp: , Rfl:    rivastigmine (EXELON) 1.5 MG capsule, Take 1.5 mg by mouth 2 (two) times daily., Disp: , Rfl:    Semaglutide (OZEMPIC, 0.25 OR 0.5 MG/DOSE, [place]), Inject into the skin once a week. Mondays, Disp: , Rfl:   Observations/Objective: Patient is well-developed, well-nourished in no acute distress.  Resting comfortably  at home.  Head is normocephalic, atraumatic.  No labored breathing.  Speech is clear and coherent with logical content.  Patient is alert and oriented at baseline.  Mild diffuse swelling of R knee, worse in the prepatellar region from what I can visualize. No visible erythema noted. Able to bend and extend the knee on exam.  Assessment and Plan: 1. Acute pain of right knee (Primary) - predniSONE (DELTASONE) 20 MG tablet; Take 2 tablets (40 mg total) by mouth daily with breakfast.  Dispense: 10 tablet; Refill: 0  3 years s/p TKR. Afebrile. Quick onset of symptoms. Not continuing to worsen but persisting now that we are 48+ hours from onset. Concern for potential flare of OA versus gout/pseudogout. No alarm symptoms present and preserved ROM. Will start Prednisone. Ice and elevate. Ok to continue OTC Tylenol . If no substantial improvement in 24 hours, needs in-person evaluation with PCP or specialist. ER for any acutely worsening symptoms despite treatment.  Follow Up Instructions: I discussed the assessment and treatment  plan with the patient. The patient was provided an opportunity to ask questions and all were answered. The patient agreed with the plan and demonstrated an understanding of the instructions.  A copy of instructions were sent to the patient via MyChart unless otherwise noted below.   The patient was advised to call back or seek an in-person evaluation if the symptoms worsen or if the condition fails to improve as anticipated.    Claire Velma Lunger, PA-C

## 2024-05-10 ENCOUNTER — Ambulatory Visit
Admission: RE | Admit: 2024-05-10 | Discharge: 2024-05-10 | Disposition: A | Discharge status: Home / Self Care | Source: Ambulatory Visit | Attending: Internal Medicine | Admitting: Internal Medicine

## 2024-05-10 ENCOUNTER — Other Ambulatory Visit: Payer: Self-pay | Admitting: Internal Medicine

## 2024-05-10 DIAGNOSIS — I82411 Acute embolism and thrombosis of right femoral vein: Secondary | ICD-10-CM

## 2024-05-10 DIAGNOSIS — M79661 Pain in right lower leg: Secondary | ICD-10-CM | POA: Diagnosis not present

## 2024-05-10 NOTE — Progress Notes (Signed)
 Patient Profile:   Claire Bishop  is a 78 y.o.  female Chief Complaint  Patient presents with  . Leg Pain    Reports right knee pain last week with swelling. She applied  heat and has taken Prednisone as directed.  Currently has pain in calf.      PROBLEM LIST: Past Medical History:  Diagnosis Date  . Carpal tunnel syndrome   . Diet-controlled type 2 diabetes mellitus (CMS/HHS-HCC) 06/23/2017  . Hx of abnormal cervical Pap smear   . Hyperlipidemia   . Osteoarthritis   . Osteoporosis, post-menopausal     Past Surgical History:  Procedure Laterality Date  . COLONOSCOPY  02/20/2000   Adenomatous Polyp, FH Colon Polyps (Sister)  . COLONOSCOPY  07/14/2017   PH Adenomatous Polyp, FH Colon Polyps (Sister): [facility] 07/2022  . REPAIR VAGINAL PROLAPSE W/WO SACROSPINOUS LIGAMENT SUSPENSION N/A 08/30/2017   Procedure: COLPOPEXY, VAGINAL; EXTRA-PERITONEAL APPROACH (SACROSPINOUS, ILIOCOCCYGEUS);  Surgeon: Lee Dorthea Kay, MD;  Location: [facility];  Service: Gynecology;  Laterality: N/A;  . COLPORRHAPHY FOR REPAIR CYSTOCELE ANTERIOR N/A 08/30/2017   Procedure: ANTERIOR COLPORRHAPHY, REPAIR OF CYSTOCELE WITH OR WITHOUT REPAIR OF URETHROCELE, INCLUDING CYSTOURETHROSCOPY;  Surgeon: Lee Dorthea Kay, MD;  Location: [facility];  Service: Gynecology;  Laterality: N/A;  . CYSTOURETHROSCOPY N/A 08/30/2017   Procedure: CYSTOURETHROSCOPY;  Surgeon: Lee Dorthea Kay, MD;  Location: [facility];  Service: Gynecology;  Laterality: N/A;  . Right total knee arthroplasty using computer-assisted navigation; Removal of retained screws from the proximal right tibia  07/09/2021   Dr Mardee  . COLONOSCOPY  06/18/2012, 11/02/2003   PH Adenomatous Polyp, FH Colon Polyps (Sister): [facility]BF 06/2017; Recall Ltr mailed 05/19/2017 (dw)  . FRACTURE SURGERY Right    Right leg fracture  . FRACTURE SURGERY Left    Left femur fracture  . Right carpal tunnel       ALLERGIES: Allergies  Allergen Reactions  . Aricept [Donepezil] Dizziness  . Ultram  [Tramadol ] Other (See Comments)    Felt weird    CURRENT MEDICATIONS: Current Outpatient Medications  Medication Sig Dispense Refill  . acetaminophen  (TYLENOL ) 500 MG tablet Take 2 tablets (1,000 mg total) by mouth every 4 (four) hours as needed for Pain    . aspirin 81 mg Cap Take 81 mg by mouth every other day    . calcium  carbonate-vitamin D3 (CALTRATE 600+D) 600 mg-5 mcg (200 unit) tablet Take 1 tablet by mouth 2 (two) times daily with meals    . fluocinonide (LIDEX) 0.05 % external solution Apply topically once daily    . hydroCHLOROthiazide  (HYDRODIURIL ) 25 MG tablet Take 1 tablet (25 mg total) by mouth once daily as needed    . multivitamin with minerals, EYE, (PRESERVISION AREDS-2) soft gel capsule Take 1 capsule by mouth 2 (two) times daily with meals    . pantoprazole  (PROTONIX ) 40 MG DR tablet Take 1 tablet (40 mg total) by mouth once daily as needed    . rivastigmine tartrate (EXELON) 1.5 MG capsule Take 1 capsule (1.5 mg total) by mouth daily with breakfast    . rosuvastatin (CRESTOR) 10 MG tablet Take 1 tablet (10 mg total) by mouth once daily 90 tablet 3  . tirzepatide (MOUNJARO) 7.5 mg/0.5 mL pen injector Inject 0.5 mLs (7.5 mg total) subcutaneously once a week 2 mL 11   No current facility-administered medications for this  visit.      HPI   CLINICAL SUMMARY:  Patient presents acutely with swelling in her right lower leg.  No dyspnea, no tachycardia, no pleuritic chest pain.  She abruptly had the swelling late Thursday.  Did an e-visit and got some prednisone which really has not helped much.  Continues to have swelling of the right lower leg.  No recent travels or surgery.  ROS: Review of systems is unremarkable for any active cardiac, respiratory, GI, GU, hematologic, neurologic, dermatologic, HEENT, or psychiatric symptoms except as noted above, 10 systems reviewed.  No  fevers, chills, or constitutional symptoms.   PHYSICAL EXAM  Vital signs:  BP (!) 140/90   Pulse 76   Wt 75.3 kg (166 lb)   SpO2 97%   BMI 30.36 kg/m  Body mass index is 30.36 kg/m.   Wt Readings from Last 3 Encounters:  05/10/24 75.3 kg (166 lb)  02/15/24 75.2 kg (165 lb 12.8 oz)  08/17/23 76.5 kg (168 lb 9.6 oz)     BP Readings from Last 3 Encounters:  05/10/24 (!) 140/90  02/15/24 138/88  08/17/23 132/88    Constitutional:NAD Neck: supple, no thyromegaly, good ROM Respiratory:clear to auscultation, no rales or wheezes Cardiovascular:RRR, no murmur or gallop Abdominal:soft, good BS, NT Ext: Right lower extremity with warmth, tenderness, positive Homans, extends to above the knee Neuro: alert and oriented X 3, grossly nonfocal     ASSESSMENT/PLAN   Right lower extremity edema/tenderness-consistent with acute DVT, ultrasound stat ordered, no clear predisposition  Dispo:   No follow-ups on file.

## 2024-05-12 DIAGNOSIS — M79661 Pain in right lower leg: Secondary | ICD-10-CM | POA: Diagnosis not present

## 2024-05-12 DIAGNOSIS — Z96651 Presence of right artificial knee joint: Secondary | ICD-10-CM | POA: Diagnosis not present

## 2024-05-16 DIAGNOSIS — M79661 Pain in right lower leg: Secondary | ICD-10-CM | POA: Diagnosis not present

## 2024-05-25 DIAGNOSIS — M79661 Pain in right lower leg: Secondary | ICD-10-CM | POA: Diagnosis not present

## 2024-05-30 ENCOUNTER — Ambulatory Visit
Admission: RE | Admit: 2024-05-30 | Discharge: 2024-05-30 | Disposition: A | Discharge status: Home / Self Care | Source: Ambulatory Visit | Attending: Internal Medicine | Admitting: Internal Medicine

## 2024-05-30 DIAGNOSIS — Z1231 Encounter for screening mammogram for malignant neoplasm of breast: Secondary | ICD-10-CM | POA: Insufficient documentation
